# Patient Record
Sex: Male | Born: 1990 | Race: White | Hispanic: No | Marital: Single | Smoking: Current every day smoker
Health system: Southern US, Community
[De-identification: ages and names within clinical notes are randomized; demographics above are authoritative.]

## PROBLEM LIST (undated history)

## (undated) HISTORY — PX: APPENDECTOMY: SHX54

## (undated) HISTORY — PX: OTHER SURGICAL HISTORY: SHX169

---
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
## 2009-10-31 ENCOUNTER — Emergency Department (HOSPITAL_COMMUNITY)
Admission: EM | Admit: 2009-10-31 | Discharge: 2009-10-31 | Payer: Self-pay | Source: Home / Self Care | Admitting: Emergency Medicine

## 2010-11-24 LAB — BASIC METABOLIC PANEL
BUN: 10 mg/dL (ref 6–23)
Calcium: 9.2 mg/dL (ref 8.4–10.5)
Chloride: 105 mEq/L (ref 96–112)
Creatinine, Ser: 0.84 mg/dL (ref 0.4–1.5)
GFR calc Af Amer: >60 mL/min (ref >60)

## 2010-11-24 LAB — DIFFERENTIAL
Basophils Relative: 1 % (ref 0–1)
Eosinophils Absolute: 0 10*3/uL (ref 0.0–0.7)
Lymphs Abs: 1.9 10*3/uL (ref 0.7–4.0)
Neutro Abs: 5.9 10*3/uL (ref 1.7–7.7)
Neutrophils Relative %: 67 % (ref 43–77)

## 2010-11-24 LAB — URINALYSIS, ROUTINE W REFLEX MICROSCOPIC
Glucose, UA: NEGATIVE mg/dL
Protein, ur: NEGATIVE mg/dL
Specific Gravity, Urine: 1.024 (ref 1.005–1.030)
Urobilinogen, UA: 0.2 mg/dL (ref 0.0–1.0)

## 2010-11-24 LAB — CBC
MCV: 86.9 fL (ref 78.0–100.0)
Platelets: 159 10*3/uL (ref 150–400)
RBC: 4.5 MIL/uL (ref 4.22–5.81)
WBC: 8.8 10*3/uL (ref 4.0–10.5)

## 2010-11-24 LAB — RAPID URINE DRUG SCREEN, HOSP PERFORMED
Barbiturates: NOT DETECTED
Opiates: NOT DETECTED

## 2010-11-24 LAB — ETHANOL: Alcohol, Ethyl (B): <5 mg/dL (ref 0–10)

## 2012-04-17 ENCOUNTER — Encounter (HOSPITAL_COMMUNITY): Payer: Self-pay | Admitting: *Deleted

## 2012-04-17 ENCOUNTER — Emergency Department (HOSPITAL_COMMUNITY)
Admission: EM | Admit: 2012-04-17 | Discharge: 2012-04-17 | Disposition: A | Discharge status: Home / Self Care | Payer: Managed Care, Other (non HMO) | Attending: Emergency Medicine | Admitting: Emergency Medicine

## 2012-04-17 ENCOUNTER — Emergency Department (HOSPITAL_COMMUNITY): Payer: Managed Care, Other (non HMO)

## 2012-04-17 DIAGNOSIS — S60229A Contusion of unspecified hand, initial encounter: Secondary | ICD-10-CM | POA: Insufficient documentation

## 2012-04-17 DIAGNOSIS — Y9364 Activity, baseball: Secondary | ICD-10-CM | POA: Insufficient documentation

## 2012-04-17 DIAGNOSIS — S60221A Contusion of right hand, initial encounter: Secondary | ICD-10-CM

## 2012-04-17 DIAGNOSIS — Y9239 Other specified sports and athletic area as the place of occurrence of the external cause: Secondary | ICD-10-CM | POA: Insufficient documentation

## 2012-04-17 DIAGNOSIS — F17200 Nicotine dependence, unspecified, uncomplicated: Secondary | ICD-10-CM | POA: Insufficient documentation

## 2012-04-17 DIAGNOSIS — Z9089 Acquired absence of other organs: Secondary | ICD-10-CM | POA: Insufficient documentation

## 2012-04-17 DIAGNOSIS — IMO0002 Reserved for concepts with insufficient information to code with codable children: Secondary | ICD-10-CM | POA: Insufficient documentation

## 2012-04-17 MED ORDER — IBUPROFEN 800 MG PO TABS
800.0000 mg | ORAL_TABLET | Freq: Once | ORAL | Status: AC
  Administered 2012-04-17: 800 mg via ORAL
  Filled 2012-04-17: qty 1

## 2012-04-17 MED ORDER — IBUPROFEN 600 MG PO TABS: 600.0000 mg | ORAL_TABLET | Freq: Four times a day (QID) | ORAL | Status: AC | PRN

## 2012-04-17 MED ORDER — TRAMADOL HCL 50 MG PO TABS: 50.0000 mg | ORAL_TABLET | Freq: Four times a day (QID) | ORAL | Status: AC | PRN

## 2012-04-17 NOTE — ED Provider Notes (Signed)
History     CSN: [id number]  Arrival date & time 04/17/12  2132   First MD Initiated Contact with Patient 04/17/12 2209      Chief Complaint  Patient presents with  . Hand Injury    (Consider location/radiation/quality/duration/timing/severity/associated sxs/prior treatment) HPI Comments: Anthony Sanchez presents with pain and swelling to his right hand after being struck by a baseball as he was running the bases during a ballgame about 1 hour before arrival here.  He has constant pain in the lateral dorsal right hand without radiation.  It is worse with movement and palpation.  He has taken no medications or treatment prior to arrival.  The history is provided by the patient.    History reviewed. No pertinent past medical history.  Past Surgical History  Procedure Date  . Appendectomy   . Tubes in ears     History reviewed. No pertinent family history.  History  Substance Use Topics  . Smoking status: Current Everyday Smoker  . Smokeless tobacco: Not on file  . Alcohol Use: No      Review of Systems  Musculoskeletal: Positive for joint swelling and arthralgias.  Skin: Negative for wound.  Neurological: Negative for weakness and numbness.    Allergies  Vicodin  Home Medications   Current Outpatient Rx  Name Route Sig Dispense Refill  . IBUPROFEN 600 MG PO TABS Oral Take 1 tablet (600 mg total) by mouth every 6 (six) hours as needed for pain. 20 tablet 0  . TRAMADOL HCL 50 MG PO TABS Oral Take 1 tablet (50 mg total) by mouth every 6 (six) hours as needed for pain. 15 tablet 0    BP 123/71  Pulse 102  Temp 98.2 F (36.8 C) (Oral)  Resp 20  Ht 6' (1.829 m)  Wt 155 lb (70.308 kg)  BMI 21.02 kg/m2  SpO2 98%  Physical Exam  Constitutional: He appears well-developed and well-nourished.  HENT:  Head: Atraumatic.  Neck: Normal range of motion.  Cardiovascular:       Pulses equal bilaterally  Musculoskeletal:       Right hand: He exhibits tenderness.  He exhibits normal range of motion, normal capillary refill, no deformity and no swelling. normal sensation noted.       Hands: Neurological: He is alert. He has normal strength. He displays normal reflexes. No sensory deficit.       Equal strength  Skin: Skin is warm and dry.  Psychiatric: He has a normal mood and affect.    ED Course  Procedures (including critical care time)  Labs Reviewed - No data to display Dg Hand Complete Right  04/17/2012  *RADIOLOGY REPORT*  Clinical Data: Medial right hand pain.  Softball injury.  RIGHT HAND - COMPLETE 3+ VIEW  Comparison: None.  Findings: There is mild ulnar deviation of the index finger.  There is no fracture identified.  The visualized carpal bones appear within normal limits.  Metacarpal bases appear normal.  MCP joints normal.  There is no fracture or radiopaque foreign body.  There may be some soft tissue swelling along the index and long fingers.  IMPRESSION: No acute osseous abnormality.  Possible soft tissue swelling of the index and long fingers.  Original Report Authenticated By: Andreas Newport, M.D.     1. Contusion of hand, right       MDM  xrays reviewed and discussed with patient.  Jones dressing applied.  Pt prescribed ibuprofen,  Encouraged RICE,  Tramadol prn increased  pain.  F/u  With pcp if not improving over the next week.        Burgess Amor, [place] 04/18/12 [phone]

## 2012-04-17 NOTE — ED Notes (Signed)
Pt alert & oriented x4, stable gait. Patient given discharge instructions, paperwork & prescription(s). Patient  instructed to stop at the registration desk to finish any additional paperwork. Patient verbalized understanding. Pt left department w/ no further questions. 

## 2012-04-17 NOTE — ED Notes (Signed)
Rt hand injury playing softball tonight

## 2012-04-18 NOTE — ED Provider Notes (Signed)
Medical screening examination/treatment/procedure(s) were performed by non-physician practitioner and as supervising physician I was immediately available for consultation/collaboration. Devoria Albe, MD, FACEP   Ward Givens, MD 04/18/12 [phone]

## 2015-07-27 ENCOUNTER — Emergency Department (HOSPITAL_COMMUNITY): Payer: Managed Care, Other (non HMO)

## 2015-07-27 ENCOUNTER — Emergency Department (HOSPITAL_COMMUNITY)
Admission: EM | Admit: 2015-07-27 | Discharge: 2015-07-27 | Disposition: A | Discharge status: Home / Self Care | Payer: Managed Care, Other (non HMO) | Attending: Emergency Medicine | Admitting: Emergency Medicine

## 2015-07-27 ENCOUNTER — Encounter (HOSPITAL_COMMUNITY): Payer: Self-pay | Admitting: *Deleted

## 2015-07-27 DIAGNOSIS — Y998 Other external cause status: Secondary | ICD-10-CM | POA: Insufficient documentation

## 2015-07-27 DIAGNOSIS — R42 Dizziness and giddiness: Secondary | ICD-10-CM | POA: Insufficient documentation

## 2015-07-27 DIAGNOSIS — S060X1A Concussion with loss of consciousness of 30 minutes or less, initial encounter: Secondary | ICD-10-CM

## 2015-07-27 DIAGNOSIS — R112 Nausea with vomiting, unspecified: Secondary | ICD-10-CM | POA: Diagnosis not present

## 2015-07-27 DIAGNOSIS — R0602 Shortness of breath: Secondary | ICD-10-CM | POA: Insufficient documentation

## 2015-07-27 DIAGNOSIS — S3992XA Unspecified injury of lower back, initial encounter: Secondary | ICD-10-CM | POA: Diagnosis not present

## 2015-07-27 DIAGNOSIS — Y9289 Other specified places as the place of occurrence of the external cause: Secondary | ICD-10-CM | POA: Insufficient documentation

## 2015-07-27 DIAGNOSIS — S29001A Unspecified injury of muscle and tendon of front wall of thorax, initial encounter: Secondary | ICD-10-CM | POA: Insufficient documentation

## 2015-07-27 DIAGNOSIS — H538 Other visual disturbances: Secondary | ICD-10-CM | POA: Diagnosis not present

## 2015-07-27 DIAGNOSIS — S0081XA Abrasion of other part of head, initial encounter: Secondary | ICD-10-CM

## 2015-07-27 DIAGNOSIS — S199XXA Unspecified injury of neck, initial encounter: Secondary | ICD-10-CM | POA: Diagnosis not present

## 2015-07-27 DIAGNOSIS — R000 Tachycardia, unspecified: Secondary | ICD-10-CM | POA: Diagnosis not present

## 2015-07-27 DIAGNOSIS — M62830 Muscle spasm of back: Secondary | ICD-10-CM | POA: Insufficient documentation

## 2015-07-27 DIAGNOSIS — Y9389 Activity, other specified: Secondary | ICD-10-CM | POA: Insufficient documentation

## 2015-07-27 DIAGNOSIS — S3991XA Unspecified injury of abdomen, initial encounter: Secondary | ICD-10-CM | POA: Diagnosis not present

## 2015-07-27 DIAGNOSIS — T07XXXA Unspecified multiple injuries, initial encounter: Secondary | ICD-10-CM

## 2015-07-27 DIAGNOSIS — T148 Other injury of unspecified body region: Secondary | ICD-10-CM | POA: Diagnosis not present

## 2015-07-27 DIAGNOSIS — F17210 Nicotine dependence, cigarettes, uncomplicated: Secondary | ICD-10-CM | POA: Insufficient documentation

## 2015-07-27 DIAGNOSIS — S0990XA Unspecified injury of head, initial encounter: Secondary | ICD-10-CM | POA: Diagnosis present

## 2015-07-27 LAB — BASIC METABOLIC PANEL
ANION GAP: 9 (ref 5–15)
BUN: 14 mg/dL (ref 6–20)
CHLORIDE: 103 mmol/L (ref 101–111)
CO2: 28 mmol/L (ref 22–32)
Calcium: 9.7 mg/dL (ref 8.9–10.3)
Creatinine, Ser: 0.78 mg/dL (ref 0.61–1.24)
GFR calc Af Amer: >60 mL/min (ref >60)
GFR calc non Af Amer: >60 mL/min (ref >60)
GLUCOSE: 94 mg/dL (ref 65–99)
POTASSIUM: 3.7 mmol/L (ref 3.5–5.1)
Sodium: 140 mmol/L (ref 135–145)

## 2015-07-27 LAB — CBC WITH DIFFERENTIAL/PLATELET
BASOS ABS: 0 10*3/uL (ref 0.0–0.1)
Basophils Relative: 0 %
EOS PCT: 0 %
Eosinophils Absolute: 0 10*3/uL (ref 0.0–0.7)
HEMATOCRIT: 41.3 % (ref 39.0–52.0)
HEMOGLOBIN: 14.4 g/dL (ref 13.0–17.0)
LYMPHS ABS: 1.9 10*3/uL (ref 0.7–4.0)
LYMPHS PCT: 18 %
MCH: 31.9 pg (ref 26.0–34.0)
MCHC: 34.9 g/dL (ref 30.0–36.0)
MCV: 91.4 fL (ref 78.0–100.0)
Monocytes Absolute: 1.1 10*3/uL — ABNORMAL HIGH (ref 0.1–1.0)
Monocytes Relative: 10 %
NEUTROS ABS: 7.7 10*3/uL (ref 1.7–7.7)
NEUTROS PCT: 72 %
Platelets: 166 10*3/uL (ref 150–400)
RBC: 4.52 MIL/uL (ref 4.22–5.81)
RDW: 12.1 % (ref 11.5–15.5)
WBC: 10.8 10*3/uL — AB (ref 4.0–10.5)

## 2015-07-27 MED ORDER — DICLOFENAC SODIUM 50 MG PO TBEC: 50.0000 mg | DELAYED_RELEASE_TABLET | Freq: Two times a day (BID) | ORAL | Status: DC

## 2015-07-27 MED ORDER — ONDANSETRON HCL 4 MG/2ML IJ SOLN
4.0000 mg | Freq: Once | INTRAMUSCULAR | Status: AC
  Administered 2015-07-27: 4 mg via INTRAVENOUS
  Filled 2015-07-27: qty 2

## 2015-07-27 MED ORDER — CYCLOBENZAPRINE HCL 10 MG PO TABS: 10.0000 mg | ORAL_TABLET | Freq: Two times a day (BID) | ORAL | Status: DC | PRN

## 2015-07-27 MED ORDER — OXYCODONE-ACETAMINOPHEN 5-325 MG PO TABS
1.0000 | ORAL_TABLET | Freq: Once | ORAL | Status: AC
  Administered 2015-07-27: 1 via ORAL
  Filled 2015-07-27: qty 1

## 2015-07-27 MED ORDER — SODIUM CHLORIDE 0.9 % IV SOLN
INTRAVENOUS | Status: DC
  Administered 2015-07-27: 21:00:00 via INTRAVENOUS

## 2015-07-27 MED ORDER — BACITRACIN ZINC 500 UNIT/GM EX OINT
TOPICAL_OINTMENT | CUTANEOUS | Status: AC
  Administered 2015-07-27: 23:00:00
  Filled 2015-07-27: qty 0.9

## 2015-07-27 MED ORDER — HYDROMORPHONE HCL 1 MG/ML IJ SOLN
0.5000 mg | Freq: Once | INTRAMUSCULAR | Status: AC
  Administered 2015-07-27: 0.5 mg via INTRAVENOUS
  Filled 2015-07-27: qty 1

## 2015-07-27 MED ORDER — IOHEXOL 300 MG/ML  SOLN
75.0000 mL | Freq: Once | INTRAMUSCULAR | Status: AC | PRN
  Administered 2015-07-27: 75 mL via INTRAVENOUS

## 2015-07-27 MED ORDER — CYCLOBENZAPRINE HCL 10 MG PO TABS
5.0000 mg | ORAL_TABLET | Freq: Once | ORAL | Status: AC
  Administered 2015-07-27: 5 mg via ORAL
  Filled 2015-07-27: qty 1
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
## 2015-07-27 NOTE — ED Notes (Signed)
Pt verbalized understanding of no driving and to use caution within 4 hours of taking pain meds due to meds cause drowsiness 
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
## 2015-12-31 ENCOUNTER — Encounter (HOSPITAL_COMMUNITY): Payer: Self-pay | Admitting: Emergency Medicine

## 2015-12-31 ENCOUNTER — Inpatient Hospital Stay (HOSPITAL_COMMUNITY)
Admission: EM | Admit: 2015-12-31 | Discharge: 2016-01-03 | DRG: 603 | Disposition: A | Discharge status: Home / Self Care | Payer: 59 | Attending: Internal Medicine | Admitting: Internal Medicine

## 2015-12-31 DIAGNOSIS — L02414 Cutaneous abscess of left upper limb: Principal | ICD-10-CM | POA: Diagnosis present

## 2015-12-31 DIAGNOSIS — F1911 Other psychoactive substance abuse, in remission: Secondary | ICD-10-CM | POA: Diagnosis present

## 2015-12-31 DIAGNOSIS — F1410 Cocaine abuse, uncomplicated: Secondary | ICD-10-CM | POA: Diagnosis present

## 2015-12-31 DIAGNOSIS — F17210 Nicotine dependence, cigarettes, uncomplicated: Secondary | ICD-10-CM | POA: Diagnosis present

## 2015-12-31 DIAGNOSIS — E876 Hypokalemia: Secondary | ICD-10-CM | POA: Diagnosis present

## 2015-12-31 DIAGNOSIS — L03114 Cellulitis of left upper limb: Secondary | ICD-10-CM | POA: Diagnosis present

## 2015-12-31 DIAGNOSIS — L0390 Cellulitis, unspecified: Secondary | ICD-10-CM

## 2015-12-31 DIAGNOSIS — L0291 Cutaneous abscess, unspecified: Secondary | ICD-10-CM | POA: Diagnosis present

## 2015-12-31 LAB — CBC WITH DIFFERENTIAL/PLATELET
BASOS PCT: 0 %
Basophils Absolute: 0 10*3/uL (ref 0.0–0.1)
EOS ABS: 0.1 10*3/uL (ref 0.0–0.7)
Eosinophils Relative: 1 %
HCT: 39.5 % (ref 39.0–52.0)
Hemoglobin: 13.6 g/dL (ref 13.0–17.0)
Lymphocytes Relative: 15 %
Lymphs Abs: 1.9 10*3/uL (ref 0.7–4.0)
MCH: 31.1 pg (ref 26.0–34.0)
MCHC: 34.4 g/dL (ref 30.0–36.0)
MCV: 90.2 fL (ref 78.0–100.0)
MONO ABS: 1 10*3/uL (ref 0.1–1.0)
MONOS PCT: 9 %
NEUTROS PCT: 75 %
Neutro Abs: 9.2 10*3/uL — ABNORMAL HIGH (ref 1.7–7.7)
Platelets: 141 10*3/uL — ABNORMAL LOW (ref 150–400)
RBC: 4.38 MIL/uL (ref 4.22–5.81)
RDW: 11.9 % (ref 11.5–15.5)
WBC: 12.1 10*3/uL — ABNORMAL HIGH (ref 4.0–10.5)

## 2015-12-31 LAB — BASIC METABOLIC PANEL
Anion gap: 10 (ref 5–15)
BUN: 15 mg/dL (ref 6–20)
CALCIUM: 9.3 mg/dL (ref 8.9–10.3)
CO2: 26 mmol/L (ref 22–32)
CREATININE: 0.89 mg/dL (ref 0.61–1.24)
Chloride: 101 mmol/L (ref 101–111)
GFR calc non Af Amer: >60 mL/min (ref >60)
Glucose, Bld: 105 mg/dL — ABNORMAL HIGH (ref 65–99)
Potassium: 3.6 mmol/L (ref 3.5–5.1)
SODIUM: 137 mmol/L (ref 135–145)

## 2015-12-31 MED ORDER — ONDANSETRON HCL 4 MG/2ML IJ SOLN
4.0000 mg | Freq: Once | INTRAMUSCULAR | Status: AC
  Administered 2015-12-31: 4 mg via INTRAVENOUS
  Filled 2015-12-31: qty 2

## 2015-12-31 MED ORDER — HYDROMORPHONE HCL 1 MG/ML IJ SOLN
1.0000 mg | Freq: Once | INTRAMUSCULAR | Status: AC
  Administered 2015-12-31: 1 mg via INTRAVENOUS
  Filled 2015-12-31: qty 1

## 2015-12-31 NOTE — ED Notes (Signed)
Reddened area marked with skin marker at 2206.

## 2015-12-31 NOTE — ED Notes (Signed)
Patient complaining of abscess to left arm since yesterday. Patient has significant redness spreading away from abscess.

## 2015-12-31 NOTE — ED Provider Notes (Signed)
CSN: [id number]     Arrival date & time 12/31/15  2127 History   First MD Initiated Contact with Patient 12/31/15 2224     Chief Complaint  Patient presents with  . Abscess     (Consider location/radiation/quality/duration/timing/severity/associated sxs/prior Treatment) Patient is a 25 y.o. male presenting with abscess. The history is provided by the patient.  Abscess Location:  Shoulder/arm Shoulder/arm abscess location:  L elbow Abscess quality: painful, redness and warmth   Red streaking: yes   Duration:  2 days Progression:  Worsening Pain details:    Quality:  Sharp   Severity:  Severe   Timing:  Constant   Progression:  Worsening Chronicity:  New Context: insect bite/sting   Relieved by:  Nothing Worsened by:  Nothing tried Ineffective treatments:  None tried  Anthony Sanchez is a 25 y.o. male who presents to the ED with redness, swelling and pain to the left arm. He reports that 2 days ago he saw a spider on his shoulder and brushed it off. Later that day he noted a bump on his arm that looked like an insect bite. Today the area became swollen, red, tender and then red streaks started. He does not think he has had fever but states that the pain is severe.   History reviewed. No pertinent past medical history. Past Surgical History  Procedure Laterality Date  . Appendectomy    . Tubes in ears     History reviewed. No pertinent family history. Social History  Substance Use Topics  . Smoking status: Current Every Day Smoker -- 1.00 packs/day    Types: Cigarettes  . Smokeless tobacco: None  . Alcohol Use: Yes     Comment: occasionally    Review of Systems Negative except as stated in HPI   Allergies  Doxycycline and Vicodin  Home Medications   Prior to Admission medications   Medication Sig Start Date End Date Taking? Authorizing Provider  diphenhydrAMINE-zinc acetate (BENADRYL) cream Apply 1 application topically 3 (three) times daily as needed for  itching.   Yes Historical Provider, MD   BP 126/74 mmHg  Pulse 85  Temp(Src) 98.5 F (36.9 C) (Oral)  Resp 20  Ht 6' (1.829 m)  Wt 72.576 kg  BMI 21.70 kg/m2  SpO2 99% Physical Exam  Constitutional: He is oriented to person, place, and time. He appears well-developed and well-nourished.  HENT:  Head: Normocephalic and atraumatic.  Eyes: Conjunctivae and EOM are normal.  Neck: Normal range of motion. Neck supple.  Cardiovascular: Tachycardia present.   Pulmonary/Chest: Effort normal.  Musculoskeletal: Normal range of motion. He exhibits no edema.       Left elbow: He exhibits swelling. Decreased range of motion: due to pain. Tenderness found.       Arms: There is a firm raised area with erythema, swelling and tenderness to the palmar aspect of the left arm near the elbow, red streaking noted. Axillary tenderness.  Radial pulse 2+, adequate circulation.   Neurological: He is alert and oriented to person, place, and time. No cranial nerve deficit.  Skin: Skin is warm and dry.  Psychiatric: He has a normal mood and affect. His behavior is normal.  Nursing note and vitals reviewed.   ED Course  Procedures (including critical care time)  Area cleaned with alcohol, lidocaine 1% without epinephrine  Needle aspirate of thick yellow drainage 1 cc Sent for culture  Labs Review Results for orders placed or performed during the hospital encounter of 12/31/15 (from the  past 24 hour(s))  CBC with Differential     Status: Abnormal   Collection Time: 12/31/15 11:00 PM  Result Value Ref Range   WBC 12.1 (H) 4.0 - 10.5 K/uL   RBC 4.38 4.22 - 5.81 MIL/uL   Hemoglobin 13.6 13.0 - 17.0 g/dL   HCT 04.539.5 40.939.0 - 81.152.0 %   MCV 90.2 78.0 - 100.0 fL   MCH 31.1 26.0 - 34.0 pg   MCHC 34.4 30.0 - 36.0 g/dL   RDW 91.411.9 78.211.5 - 95.615.5 %   Platelets 141 (L) 150 - 400 K/uL   Neutrophils Relative % 75 %   Neutro Abs 9.2 (H) 1.7 - 7.7 K/uL   Lymphocytes Relative 15 %   Lymphs Abs 1.9 0.7 - 4.0 K/uL    Monocytes Relative 9 %   Monocytes Absolute 1.0 0.1 - 1.0 K/uL   Eosinophils Relative 1 %   Eosinophils Absolute 0.1 0.0 - 0.7 K/uL   Basophils Relative 0 %   Basophils Absolute 0.0 0.0 - 0.1 K/uL  Basic metabolic panel     Status: Abnormal   Collection Time: 12/31/15 11:00 PM  Result Value Ref Range   Sodium 137 135 - 145 mmol/L   Potassium 3.6 3.5 - 5.1 mmol/L   Chloride 101 101 - 111 mmol/L   CO2 26 22 - 32 mmol/L   Glucose, Bld 105 (H) 65 - 99 mg/dL   BUN 15 6 - 20 mg/dL   Creatinine, Ser 2.130.89 0.61 - 1.24 mg/dL   Calcium 9.3 8.9 - 08.610.3 mg/dL   GFR calc non Af Amer >60 >60 mL/min   GFR calc Af Amer >60 >60 mL/min   Anion gap 10 5 - 15    Imaging Review No results found. I have personally reviewed and evaluated the lab results as part of my medical decision-making.   MDM  25 y.o. male with abscess to the left arm and cellulitis. Discussed with Dr. Conley RollsLe and he will admit to continue antibiotics and observation. Discussed with the patient plan and he agrees.   Final diagnoses:  Abscess of left arm  Cellulitis of left upper extremity       Janne NapoleonHope M Neese, NP 01/01/16 0121  Mancel BaleElliott Wentz, MD 01/02/16 0030

## 2016-01-01 ENCOUNTER — Encounter (HOSPITAL_COMMUNITY): Payer: Self-pay | Admitting: Internal Medicine

## 2016-01-01 DIAGNOSIS — L0390 Cellulitis, unspecified: Secondary | ICD-10-CM | POA: Diagnosis not present

## 2016-01-01 DIAGNOSIS — F17210 Nicotine dependence, cigarettes, uncomplicated: Secondary | ICD-10-CM | POA: Diagnosis present

## 2016-01-01 DIAGNOSIS — F1410 Cocaine abuse, uncomplicated: Secondary | ICD-10-CM | POA: Diagnosis present

## 2016-01-01 DIAGNOSIS — L0291 Cutaneous abscess, unspecified: Secondary | ICD-10-CM | POA: Diagnosis present

## 2016-01-01 DIAGNOSIS — F1990 Other psychoactive substance use, unspecified, uncomplicated: Secondary | ICD-10-CM | POA: Diagnosis not present

## 2016-01-01 DIAGNOSIS — F1911 Other psychoactive substance abuse, in remission: Secondary | ICD-10-CM | POA: Diagnosis present

## 2016-01-01 DIAGNOSIS — E876 Hypokalemia: Secondary | ICD-10-CM | POA: Diagnosis present

## 2016-01-01 DIAGNOSIS — L03114 Cellulitis of left upper limb: Secondary | ICD-10-CM | POA: Diagnosis present

## 2016-01-01 DIAGNOSIS — L02414 Cutaneous abscess of left upper limb: Secondary | ICD-10-CM | POA: Diagnosis present

## 2016-01-01 LAB — CBC
HEMATOCRIT: 39.1 % (ref 39.0–52.0)
HEMOGLOBIN: 13.2 g/dL (ref 13.0–17.0)
MCH: 30.8 pg (ref 26.0–34.0)
MCHC: 33.8 g/dL (ref 30.0–36.0)
MCV: 91.4 fL (ref 78.0–100.0)
Platelets: 131 10*3/uL — ABNORMAL LOW (ref 150–400)
RBC: 4.28 MIL/uL (ref 4.22–5.81)
RDW: 12.2 % (ref 11.5–15.5)
WBC: 14.7 10*3/uL — AB (ref 4.0–10.5)

## 2016-01-01 LAB — BASIC METABOLIC PANEL
ANION GAP: 9 (ref 5–15)
BUN: 13 mg/dL (ref 6–20)
CALCIUM: 8.5 mg/dL — AB (ref 8.9–10.3)
CO2: 25 mmol/L (ref 22–32)
Chloride: 103 mmol/L (ref 101–111)
Creatinine, Ser: 0.84 mg/dL (ref 0.61–1.24)
Glucose, Bld: 142 mg/dL — ABNORMAL HIGH (ref 65–99)
POTASSIUM: 3.2 mmol/L — AB (ref 3.5–5.1)
SODIUM: 137 mmol/L (ref 135–145)

## 2016-01-01 MED ORDER — POTASSIUM CHLORIDE CRYS ER 20 MEQ PO TBCR
40.0000 meq | EXTENDED_RELEASE_TABLET | Freq: Every day | ORAL | Status: DC
  Filled 2016-01-01: qty 2

## 2016-01-01 MED ORDER — LIDOCAINE HCL (PF) 1 % IJ SOLN
5.0000 mL | Freq: Once | INTRAMUSCULAR | Status: AC
  Administered 2016-01-01: 5 mL
  Filled 2016-01-01: qty 5

## 2016-01-01 MED ORDER — VANCOMYCIN HCL IN DEXTROSE 1-5 GM/200ML-% IV SOLN
1000.0000 mg | Freq: Once | INTRAVENOUS | Status: AC
  Administered 2016-01-01: 1000 mg via INTRAVENOUS
  Filled 2016-01-01: qty 200

## 2016-01-01 MED ORDER — HYDROMORPHONE HCL 1 MG/ML IJ SOLN
1.0000 mg | INTRAMUSCULAR | Status: DC | PRN
  Administered 2016-01-01 – 2016-01-02 (×10): 1 mg via INTRAVENOUS
  Filled 2016-01-01 (×10): qty 1

## 2016-01-01 MED ORDER — TETANUS-DIPHTH-ACELL PERTUSSIS 5-2.5-18.5 LF-MCG/0.5 IM SUSP
0.5000 mL | Freq: Once | INTRAMUSCULAR | Status: AC
  Administered 2016-01-01: 0.5 mL via INTRAMUSCULAR
  Filled 2016-01-01: qty 0.5

## 2016-01-01 MED ORDER — DEXTROSE-NACL 5-0.9 % IV SOLN
INTRAVENOUS | Status: DC
  Administered 2016-01-02: via INTRAVENOUS

## 2016-01-01 MED ORDER — HEPARIN SODIUM (PORCINE) 5000 UNIT/ML IJ SOLN
5000.0000 [IU] | Freq: Three times a day (TID) | INTRAMUSCULAR | Status: DC
  Administered 2016-01-01: 5000 [IU] via SUBCUTANEOUS
  Filled 2016-01-01: qty 1

## 2016-01-01 MED ORDER — VANCOMYCIN HCL 10 G IV SOLR
1500.0000 mg | Freq: Two times a day (BID) | INTRAVENOUS | Status: DC
  Administered 2016-01-01 – 2016-01-03 (×4): 1500 mg via INTRAVENOUS
  Filled 2016-01-01 (×8): qty 1500

## 2016-01-01 MED ORDER — MUPIROCIN CALCIUM 2 % EX CREA
TOPICAL_CREAM | Freq: Two times a day (BID) | CUTANEOUS | Status: DC
  Administered 2016-01-01 – 2016-01-02 (×4): via TOPICAL
  Administered 2016-01-03: 1 via TOPICAL
  Filled 2016-01-01: qty 15

## 2016-01-01 MED ORDER — TETANUS-DIPHTHERIA TOXOIDS TD 5-2 LFU IM INJ
0.5000 mL | INJECTION | Freq: Once | INTRAMUSCULAR | Status: DC
  Filled 2016-01-01: qty 0.5

## 2016-01-01 MED ORDER — HYDROCODONE-ACETAMINOPHEN 5-325 MG PO TABS: 1.0000 | ORAL_TABLET | ORAL | Status: DC | PRN

## 2016-01-01 MED ORDER — ENOXAPARIN SODIUM 40 MG/0.4ML ~~LOC~~ SOLN
40.0000 mg | SUBCUTANEOUS | Status: DC
  Administered 2016-01-01 – 2016-01-02 (×2): 40 mg via SUBCUTANEOUS
  Filled 2016-01-01 (×3): qty 0.4

## 2016-01-01 MED ORDER — PIPERACILLIN-TAZOBACTAM 3.375 G IVPB
3.3750 g | Freq: Three times a day (TID) | INTRAVENOUS | Status: DC
  Administered 2016-01-01 – 2016-01-03 (×7): 3.375 g via INTRAVENOUS
  Filled 2016-01-01 (×7): qty 50

## 2016-01-01 MED ORDER — OXYCODONE-ACETAMINOPHEN 7.5-325 MG PO TABS
1.0000 | ORAL_TABLET | ORAL | Status: DC | PRN
  Administered 2016-01-02 – 2016-01-03 (×3): 1 via ORAL
  Filled 2016-01-01 (×3): qty 1

## 2016-01-01 MED ORDER — PIPERACILLIN-TAZOBACTAM 3.375 G IVPB 30 MIN
3.3750 g | Freq: Once | INTRAVENOUS | Status: AC
  Administered 2016-01-01: 3.375 g via INTRAVENOUS
  Filled 2016-01-01: qty 50

## 2016-01-01 MED ORDER — POTASSIUM CHLORIDE CRYS ER 20 MEQ PO TBCR
40.0000 meq | EXTENDED_RELEASE_TABLET | Freq: Every day | ORAL | Status: AC
  Administered 2016-01-01: 40 meq via ORAL

## 2016-01-01 MED ORDER — DEXTROSE-NACL 5-0.9 % IV SOLN
INTRAVENOUS | Status: DC
  Administered 2016-01-01: 03:00:00 via INTRAVENOUS
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
## 2016-01-01 NOTE — Progress Notes (Signed)
0637 WBC 14.7, K+ 3.2 this AM lab results. MD notified.

## 2016-01-01 NOTE — Progress Notes (Signed)
Pain down to 6 from 8, Resting

## 2016-01-01 NOTE — Consult Note (Signed)
Reason for Consult: Right is, left arm Referring Physician: Dr. [name] is an 25 y.o. male.  HPI: Patient is a 25 year old white male who used IV cocaine in the left arm several days ago. He has since developed left antecubital arm swelling. He was seen in the emergency room and some purulent fluid was drained from the wound. He was admitted to the hospital for further evaluation treatment. He states he rarely uses IV cocaine. His left arm is tender to touch. Some swelling has worsened over the last 48 hours.  History reviewed. No pertinent past medical history.  Past Surgical History  Procedure Laterality Date  . Appendectomy    . Tubes in ears      History reviewed. No pertinent family history.  Social History:  reports that he has been smoking Cigarettes.  He has been smoking about 1.00 pack per day. He does not have any smokeless tobacco history on file. He reports that he drinks alcohol. He reports that he uses illicit drugs (Marijuana).  Allergies:  Allergies  Allergen Reactions  . Doxycycline Itching and Rash  . Vicodin [Hydrocodone-Acetaminophen] Itching and Rash    Medications:  Prior to Admission:  Prescriptions prior to admission  Medication Sig Dispense Refill Last Dose  . diphenhydrAMINE-zinc acetate (BENADRYL) cream Apply 1 application topically 3 (three) times daily as needed for itching.   12/31/2015 at Unknown time    Results for orders placed or performed during the hospital encounter of 12/31/15 (from the past 48 hour(s))  CBC with Differential     Status: Abnormal   Collection Time: 12/31/15 11:00 PM  Result Value Ref Range   WBC 12.1 (H) 4.0 - 10.5 K/uL   RBC 4.38 4.22 - 5.81 MIL/uL   Hemoglobin 13.6 13.0 - 17.0 g/dL   HCT 39.5 39.0 - 52.0 %   MCV 90.2 78.0 - 100.0 fL   MCH 31.1 26.0 - 34.0 pg   MCHC 34.4 30.0 - 36.0 g/dL   RDW 11.9 11.5 - 15.5 %   Platelets 141 (L) 150 - 400 K/uL   Neutrophils Relative % 75 %   Neutro Abs 9.2 (H)  1.7 - 7.7 K/uL   Lymphocytes Relative 15 %   Lymphs Abs 1.9 0.7 - 4.0 K/uL   Monocytes Relative 9 %   Monocytes Absolute 1.0 0.1 - 1.0 K/uL   Eosinophils Relative 1 %   Eosinophils Absolute 0.1 0.0 - 0.7 K/uL   Basophils Relative 0 %   Basophils Absolute 0.0 0.0 - 0.1 K/uL  Basic metabolic panel     Status: Abnormal   Collection Time: 12/31/15 11:00 PM  Result Value Ref Range   Sodium 137 135 - 145 mmol/L   Potassium 3.6 3.5 - 5.1 mmol/L   Chloride 101 101 - 111 mmol/L   CO2 26 22 - 32 mmol/L   Glucose, Bld 105 (H) 65 - 99 mg/dL   BUN 15 6 - 20 mg/dL   Creatinine, Ser 0.89 0.61 - 1.24 mg/dL   Calcium 9.3 8.9 - 10.3 mg/dL   GFR calc non Af Amer >60 >60 mL/min   GFR calc Af Amer >60 >60 mL/min    Comment: (NOTE) The eGFR has been calculated using the CKD EPI equation. This calculation has not been validated in all clinical situations. eGFR's persistently <60 mL/min signify possible Chronic Kidney Disease.    Anion gap 10 5 - 15  Basic metabolic panel     Status: Abnormal   Collection  Time: 01/01/16  4:37 AM  Result Value Ref Range   Sodium 137 135 - 145 mmol/L   Potassium 3.2 (L) 3.5 - 5.1 mmol/L   Chloride 103 101 - 111 mmol/L   CO2 25 22 - 32 mmol/L   Glucose, Bld 142 (H) 65 - 99 mg/dL   BUN 13 6 - 20 mg/dL   Creatinine, Ser 0.84 0.61 - 1.24 mg/dL   Calcium 8.5 (L) 8.9 - 10.3 mg/dL   GFR calc non Af Amer >60 >60 mL/min   GFR calc Af Amer >60 >60 mL/min    Comment: (NOTE) The eGFR has been calculated using the CKD EPI equation. This calculation has not been validated in all clinical situations. eGFR's persistently <60 mL/min signify possible Chronic Kidney Disease.    Anion gap 9 5 - 15  CBC     Status: Abnormal   Collection Time: 01/01/16  4:37 AM  Result Value Ref Range   WBC 14.7 (H) 4.0 - 10.5 K/uL   RBC 4.28 4.22 - 5.81 MIL/uL   Hemoglobin 13.2 13.0 - 17.0 g/dL   HCT 39.1 39.0 - 52.0 %   MCV 91.4 78.0 - 100.0 fL   MCH 30.8 26.0 - 34.0 pg   MCHC 33.8 30.0  - 36.0 g/dL   RDW 12.2 11.5 - 15.5 %   Platelets 131 (L) 150 - 400 K/uL    No results found.  ROS:  Constitutional: negative Respiratory: negative Cardiovascular: negative Integument/breast: Tender left arm.  Blood pressure 120/60, pulse 89, temperature 98.6 F (37 C), temperature source Oral, resp. rate 20, height 6' (1.829 m), weight 72.576 kg (160 lb), SpO2 98 %. Physical Exam: Patient is a well-developed well-nourished white male no acute distress. Left arm examination reveals a small 1 cm abscess cavity which has been drained. Some purulent fluid is still emanating from it. Surrounding erythema is noted. There is no neurovascular compromise of the left hand. He is able to move the left hand without difficulty.  Assessment/Plan: Impression: Cellulitis with abscess, left antecubital fossa secondary to IV drug use Plan: Will elevate left arm. Agree with Zosyn and vancomycin. No need for I and D at this time as it is already draining. Local wound care has been ordered.  Gayanne Prescott A 01/01/2016, 7:49 AM

## 2016-01-01 NOTE — Progress Notes (Signed)
Pharmacy Antibiotic Note  Anthony FailMichael J Sanchez is a 25 y.o. male admitted on 12/31/2015 with cellulitis.  Pharmacy has been consulted for vancomycin and zosyn dosing. Initial doses given in the ED  Plan: Vancomycin 1500 IV every 12 hours.  Goal trough 10-15 mcg/mL.  Cont zosyn 3.375 gm IV q8 hours Monitor renal function, cultures and clinical course  Height: 6' (182.9 cm) Weight: 160 lb (72.576 kg) IBW/kg (Calculated) : 77.6  Temp (24hrs), Avg:98.6 F (37 C), Min:98.4 F (36.9 C), Max:98.7 F (37.1 C)   Recent Labs Lab 12/31/15 2300 01/01/16 0437  WBC 12.1* 14.7*  CREATININE 0.89 0.84    Estimated Creatinine Clearance: 138 mL/min (by C-G formula based on Cr of 0.84).    Allergies  Allergen Reactions  . Doxycycline Itching and Rash  . Vicodin [Hydrocodone-Acetaminophen] Itching and Rash    Antimicrobials this admission: vanc 4/30 >>  zosyn 4/30 >>   Thank you for allowing pharmacy to be a part of this patient's care.  Talbert CageSeay, Anthony Sanchez 01/01/2016 8:16 AM

## 2016-01-01 NOTE — ED Provider Notes (Signed)
Pt complains of worsening, burning left inner forearm pain and swelling  beginning yesterday. He reports that a spider was on his shirt two days ago. Pt states that he tried to pop the swollen area yesterday and no pus was discharged. He endorses pain exacerbation with palpation to his left arm; he states that his entire left arm is swollen and painful starting tonight, and the pain radiates into his left underarm as well. He applied ice to his abscess this afternoon with no relief. Pt is left hand dominant. He denies fever, chills, or any other associated symptoms.   Patient is noted to have an area that is consistent with abscess in his antecubital area of his left forearm with redness and swelling of his maximal forearm and distal upper arm that extends almost to his axilla.      Medical screening examination/treatment/procedure(s) were conducted as a shared visit with non-physician practitioner(s) and myself.  I personally evaluated the patient during the encounter.     Devoria AlbeIva Graves Nipp, MD, FACEP  I personally performed the services described in this documentation, which was scribed in my presence. The recorded information has been reviewed and considered.  Devoria AlbeIva Ivalene Platte, MD, Concha PyoFACEP    Apple Dearmas, MD 01/01/16 [phone]

## 2016-01-01 NOTE — H&P (Signed)
History and Physical    Anthony Sanchez JXB:[id number] DOB: February 06, 1991 DOA: 12/31/2015  Referring MD/NP/PA: Kerrie BuffaloHope Neese NP.  PCP: No primary care provider on file.  Outpatient Specialists: none Patient coming from: home  Chief Complaint: abscess  HPI: Anthony Sanchez is a 25 y.o. male who is otherwise normally healthy, presents to the ED with complaints of worsening, burning pain that radiates up his arm and to his armpit which onset yesterday. He admits to pain exacerbation with palpation of the arm.  He reports that the abscess was small yesterday; but it has since grown in size and the pain has increased. He has a hx of IV drug use, typically injecting cocaine. He completed rehab about six months ago but relapsed approximately 3 months ago. He denies any IV drug use in the past month, though he does still smoke marajuana. He does not have DM. He denies any fever, chills, or other associated symptoms.  ED Course: While in the ED his WBC and glucose were found to be mildly elevated. His vital signs were stable. Wound cultures were collected. Hospitalist was asked to refer for admission.  Review of Systems: As per HPI otherwise 10 point review of systems negative.    History reviewed. No pertinent past medical history.  Past Surgical History  Procedure Laterality Date  . Appendectomy    . Tubes in ears       reports that he has been smoking Cigarettes.  He has been smoking about 1.00 pack per day. He does not have any smokeless tobacco history on file. He reports that he drinks alcohol. He reports that he uses illicit drugs (Marijuana).  Allergies  Allergen Reactions  . Doxycycline Itching and Rash  . Vicodin [Hydrocodone-Acetaminophen] Itching and Rash    History reviewed. No pertinent family history.   Prior to Admission medications   Medication Sig Start Date End Date Taking? Authorizing Provider  diphenhydrAMINE-zinc acetate (BENADRYL) cream Apply 1 application  topically 3 (three) times daily as needed for itching.   Yes Historical Provider, MD    Physical Exam: Filed Vitals:   12/31/15 2145 12/31/15 2341  BP: 139/73 126/74  Pulse: 101 85  Temp: 98.4 F (36.9 C) 98.5 F (36.9 C)  TempSrc: Oral Oral  Resp: 14 20  Height: 6' (1.829 m)   Weight: 72.576 kg (160 lb)   SpO2: 99% 99%      Constitutional: NAD, calm, comfortable Filed Vitals:   12/31/15 2145 12/31/15 2341  BP: 139/73 126/74  Pulse: 101 85  Temp: 98.4 F (36.9 C) 98.5 F (36.9 C)  TempSrc: Oral Oral  Resp: 14 20  Height: 6' (1.829 m)   Weight: 72.576 kg (160 lb)   SpO2: 99% 99%   Eyes: PERRL, lids and conjunctivae normal ENMT: Mucous membranes are moist. Posterior pharynx clear of any exudate or lesions.Normal dentition.  Neck: normal, supple, no masses, no thyromegaly Respiratory: clear to auscultation bilaterally, no wheezing, no crackles. Normal respiratory effort. No accessory muscle use.  Cardiovascular: Regular rate and rhythm, no murmurs / rubs / gallops. No extremity edema. 2+ pedal pulses. No carotid bruits.  Abdomen: no tenderness, no masses palpated. No hepatosplenomegaly. Bowel sounds positive.  Musculoskeletal: no clubbing / cyanosis. No joint deformity upper and lower extremities. Good ROM, no contractures. Normal muscle tone.  Skin: left arm edema, erythema. Small area of fluctuant. She has purulent aspiration done by ED physician. Neurologic: CN 2-12 grossly intact. Sensation intact, DTR normal. Strength 5/5 in all 4.  Psychiatric: Normal judgment and insight. Alert and oriented x 3. Normal mood.   Labs on Admission: I have personally reviewed following labs and imaging studies  CBC:  Recent Labs Lab 12/31/15 2300  WBC 12.1*  NEUTROABS 9.2*  HGB 13.6  HCT 39.5  MCV 90.2  PLT 141*   Basic Metabolic Panel:  Recent Labs Lab 12/31/15 2300  NA 137  K 3.6  CL 101  CO2 26  GLUCOSE 105*  BUN 15  CREATININE 0.89  CALCIUM 9.3   Urine  analysis:    Component Value Date/Time   COLORURINE YELLOW 10/31/2009 1848   APPEARANCEUR CLEAR 10/31/2009 1848   LABSPEC 1.024 10/31/2009 1848   PHURINE 6.0 10/31/2009 1848   GLUCOSEU NEGATIVE 10/31/2009 1848   HGBUR NEGATIVE 10/31/2009 1848   BILIRUBINUR NEGATIVE 10/31/2009 1848   KETONESUR NEGATIVE 10/31/2009 1848   PROTEINUR NEGATIVE 10/31/2009 1848   UROBILINOGEN 0.2 10/31/2009 1848   NITRITE NEGATIVE 10/31/2009 1848   LEUKOCYTESUR  10/31/2009 1848    NEGATIVE MICROSCOPIC NOT DONE ON URINES WITH NEGATIVE PROTEIN, BLOOD, LEUKOCYTES, NITRITE, OR GLUCOSE <1000 mg/dL.    EKG: Independently reviewed.   Assessment/Plan Principal Problem:   Cellulitis and abscess Active Problems:   History of drug abuse  1. Cellulitis with abscess. Will give Vanc and Zosyn. Will give pain meds. Will also give tetanus shot. Will be kept NPO for possible I&D. Will consult general surgery. Wound cultures collected and his site was marked.  2. Hx of IV drug use. He recently completed rehab, but relapsed about 3 months ago. Denies any IV use for past month.  Will give narcotic for his acute pain.  He and his wife is agreeable to this.    DVT prophylaxis: Lovenox Code Status: Full  Family Communication: wife, Hospital doctor, present at bedside. Disposition Plan: anticipate discharge home in 1-2 days Consults called: none Admission status: inpatient   Houston Siren, MD FACP [facility]  If 7PM-7AM, please contact night-coverage [email or web address] Password [email or web address] 01/01/2016, 1:16 AM   By signing my name below, I, Adron Bene, attest that this documentation has been prepared under the direction and in the presence of Houston Siren, MD. Electronically Signed: Adron Bene, Scribe 01/01/2016 1:12am

## 2016-01-01 NOTE — Progress Notes (Signed)
Introduced self to patient. Patient requesting pain medication. Rating pain to left arm 8 that is throbbing.

## 2016-01-02 LAB — CBC
HCT: 38 % — ABNORMAL LOW (ref 39.0–52.0)
HEMOGLOBIN: 12.6 g/dL — AB (ref 13.0–17.0)
MCH: 30.4 pg (ref 26.0–34.0)
MCHC: 33.2 g/dL (ref 30.0–36.0)
MCV: 91.6 fL (ref 78.0–100.0)
PLATELETS: 126 10*3/uL — AB (ref 150–400)
RBC: 4.15 MIL/uL — AB (ref 4.22–5.81)
RDW: 12.2 % (ref 11.5–15.5)
WBC: 10.9 10*3/uL — AB (ref 4.0–10.5)

## 2016-01-02 LAB — BASIC METABOLIC PANEL
Anion gap: 7 (ref 5–15)
BUN: 7 mg/dL (ref 6–20)
CO2: 25 mmol/L (ref 22–32)
CREATININE: 0.73 mg/dL (ref 0.61–1.24)
Calcium: 8.6 mg/dL — ABNORMAL LOW (ref 8.9–10.3)
Chloride: 105 mmol/L (ref 101–111)
Glucose, Bld: 107 mg/dL — ABNORMAL HIGH (ref 65–99)
Potassium: 3.8 mmol/L (ref 3.5–5.1)
SODIUM: 137 mmol/L (ref 135–145)

## 2016-01-02 LAB — HIV ANTIBODY (ROUTINE TESTING W REFLEX): HIV Screen 4th Generation wRfx: NONREACTIVE

## 2016-01-02 LAB — HEPATITIS PANEL, ACUTE
HEP B S AG: NEGATIVE
Hep A IgM: NEGATIVE
Hep B C IgM: NEGATIVE

## 2016-01-02 NOTE — Progress Notes (Signed)
  Subjective: States his left arm is less swollen.  Objective: Vital signs in last 24 hours: Temp:  [98 F (36.7 C)-100.2 F (37.9 C)] 98 F (36.7 C) (04/30 0700) Pulse Rate:  [74-81] 74 (04/30 0700) Resp:  [20] 20 (04/29 2140) BP: (106-113)/(50-68) 110/65 mmHg (04/30 0700) SpO2:  [98 %-100 %] 100 % (04/30 0700) Last BM Date: 12/31/15  Intake/Output from previous day: 04/29 0701 - 04/30 0700 In: 720 [P.O.:720] Out: -  Intake/Output this shift:    General appearance: alert, cooperative and no distress Extremities: Left arm swelling and erythema has decreased. Purulent fluid expressed from wound. Cultures were pending.  Lab Results:   Recent Labs  01/01/16 0437 01/02/16 0623  WBC 14.7* 10.9*  HGB 13.2 12.6*  HCT 39.1 38.0*  PLT 131* 126*   BMET  Recent Labs  01/01/16 0437 01/02/16 0623  NA 137 137  K 3.2* 3.8  CL 103 105  CO2 25 25  GLUCOSE 142* 107*  BUN 13 7  CREATININE 0.84 0.73  CALCIUM 8.5* 8.6*   PT/INR No results for input(s): LABPROT, INR in the last 72 hours.  Studies/Results: No results found.  Anti-infectives: Anti-infectives    Start     Dose/Rate Route Frequency Ordered Stop   01/01/16 0800  piperacillin-tazobactam (ZOSYN) IVPB 3.375 g     3.375 g 12.5 mL/hr over 240 Minutes Intravenous Every 8 hours 01/01/16 0750     01/01/16 0800  vancomycin (VANCOCIN) 1,500 mg in sodium chloride 0.9 % 500 mL IVPB     1,500 mg 250 mL/hr over 120 Minutes Intravenous Every 12 hours 01/01/16 0750     01/01/16 0030  vancomycin (VANCOCIN) IVPB 1000 mg/200 mL premix     1,000 mg 200 mL/hr over 60 Minutes Intravenous  Once 01/01/16 0016 01/01/16 0229   01/01/16 0030  piperacillin-tazobactam (ZOSYN) IVPB 3.375 g     3.375 g 100 mL/hr over 30 Minutes Intravenous  Once 01/01/16 0016 01/01/16 0122      Assessment/Plan: Impression: Cellulitis with draining abscess, left antecubital fossa, secondary to IV drug use. Plan: No cyanosis is much improved.  Wound is draining. No need for surgical incision and drainage. Continue current care.  LOS: 1 day    Kegan Shepardson A 01/02/2016
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
## 2016-01-03 DIAGNOSIS — L0291 Cutaneous abscess, unspecified: Secondary | ICD-10-CM

## 2016-01-03 DIAGNOSIS — L0390 Cellulitis, unspecified: Secondary | ICD-10-CM

## 2016-01-03 DIAGNOSIS — F1990 Other psychoactive substance use, unspecified, uncomplicated: Secondary | ICD-10-CM

## 2016-01-03 LAB — CBC
HEMATOCRIT: 38.2 % — AB (ref 39.0–52.0)
Hemoglobin: 12.7 g/dL — ABNORMAL LOW (ref 13.0–17.0)
MCH: 30.4 pg (ref 26.0–34.0)
MCHC: 33.2 g/dL (ref 30.0–36.0)
MCV: 91.4 fL (ref 78.0–100.0)
PLATELETS: 134 10*3/uL — AB (ref 150–400)
RBC: 4.18 MIL/uL — ABNORMAL LOW (ref 4.22–5.81)
RDW: 12.1 % (ref 11.5–15.5)
WBC: 9.8 10*3/uL (ref 4.0–10.5)

## 2016-01-03 LAB — PROTIME-INR
INR: 1.21 (ref 0.00–1.49)
PROTHROMBIN TIME: 15.4 s — AB (ref 11.6–15.2)

## 2016-01-03 MED ORDER — KETOROLAC TROMETHAMINE 10 MG PO TABS: 10.0000 mg | ORAL_TABLET | Freq: Four times a day (QID) | ORAL | Status: AC | PRN

## 2016-01-03 MED ORDER — SULFAMETHOXAZOLE-TRIMETHOPRIM 800-160 MG PO TABS: 1.0000 | ORAL_TABLET | Freq: Two times a day (BID) | ORAL | Status: AC

## 2016-01-03 NOTE — Progress Notes (Addendum)
Patient discharged with instructions, prescription, and care notes.  Verbalized understanding via teach back.  IV was removed and the site was WNL. Patient voiced no further complaints or concerns at the time of discharge.  Appointments scheduled per instructions.  Patient left the floor via ambulation with staff and family in stable condition.   Patient changed his dressing.  The patient verbalized how he was suppose to change the dressing daily.  The supplies from his room was sent home with him.
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
## 2016-01-03 NOTE — Care Management Note (Signed)
Case Management Note  Patient Details  Name: Synetta FailMichael J Zatarain MRN: [id number] Date of Birth: October 23, 1990  Subjective/Objective:                    Action/Plan:Home with self care.   Expected Discharge Date:                  Expected Discharge Plan:  Home/Self Care  In-House Referral:     Discharge planning Services  CM Consult  Post Acute Care Choice:    Choice offered to:  NA  DME Arranged:  N/A DME Agency:  NA  HH Arranged:    HH Agency:     Status of Service:  Completed, signed off  Medicare Important Message Given:    Date Medicare IM Given:    Medicare IM give by:    Date Additional Medicare IM Given:    Additional Medicare Important Message give by:     If discussed at Long Length of Stay Meetings, dates discussed:    Additional Comments:  Adonis HugueninBerkhead, Davena Julian L, RN 01/03/2016, 11:22 AM

## 2016-01-03 NOTE — Progress Notes (Signed)
  Subjective: Patient states the left arm swelling and pain have significantly decreased.  Objective: Vital signs in last 24 hours: Temp:  [97.1 F (36.2 C)-98.4 F (36.9 C)] 97.1 F (36.2 C) (05/01 0657) Pulse Rate:  [72-80] 74 (05/01 0657) Resp:  [18] 18 (04/30 1439) BP: (117-137)/(61-67) 118/61 mmHg (05/01 0657) SpO2:  [96 %-100 %] 100 % (05/01 0657) Last BM Date: 01/02/16  Intake/Output from previous day: 04/30 0701 - 05/01 0700 In: 720 [P.O.:720] Out: -  Intake/Output this shift:    General appearance: alert, cooperative and no distress Skin: Left antecubital cellulitis and induration significantly improved. Less drainage noted from abscess. Left arm neurovascularly intact.  Lab Results:   Recent Labs  01/02/16 0623 01/03/16 0501  WBC 10.9* 9.8  HGB 12.6* 12.7*  HCT 38.0* 38.2*  PLT 126* 134*   BMET  Recent Labs  01/01/16 0437 01/02/16 0623  NA 137 137  K 3.2* 3.8  CL 103 105  CO2 25 25  GLUCOSE 142* 107*  BUN 13 7  CREATININE 0.84 0.73  CALCIUM 8.5* 8.6*   PT/INR  Recent Labs  01/03/16 0501  LABPROT 15.4*  INR 1.21    Studies/Results: No results found.  Anti-infectives: Anti-infectives    Start     Dose/Rate Route Frequency Ordered Stop   01/01/16 0800  piperacillin-tazobactam (ZOSYN) IVPB 3.375 g     3.375 g 12.5 mL/hr over 240 Minutes Intravenous Every 8 hours 01/01/16 0750     01/01/16 0800  vancomycin (VANCOCIN) 1,500 mg in sodium chloride 0.9 % 500 mL IVPB     1,500 mg 250 mL/hr over 120 Minutes Intravenous Every 12 hours 01/01/16 0750     01/01/16 0030  vancomycin (VANCOCIN) IVPB 1000 mg/200 mL premix     1,000 mg 200 mL/hr over 60 Minutes Intravenous  Once 01/01/16 0016 01/01/16 0229   01/01/16 0030  piperacillin-tazobactam (ZOSYN) IVPB 3.375 g     3.375 g 100 mL/hr over 30 Minutes Intravenous  Once 01/01/16 0016 01/01/16 0122      Assessment/Plan: Impression: Cellulitis and abscess, left arm, significant  improved. Plan: Okay for discharge from surgery standpoint. I given patient instructions on wound care. I would discharge on Bactrim for 10 days. He is to follow-up in my office as needed.  LOS: 2 days    Markis Langland A 01/03/2016

## 2016-01-04 LAB — WOUND CULTURE

## 2016-01-06 LAB — CULTURE, BLOOD (ROUTINE X 2)
Culture: NO GROWTH
Culture: NO GROWTH

## 2016-07-05 ENCOUNTER — Emergency Department (HOSPITAL_COMMUNITY)
Admission: EM | Admit: 2016-07-05 | Discharge: 2016-07-05 | Disposition: A | Discharge status: Home / Self Care | Payer: BLUE CROSS/BLUE SHIELD | Attending: Dermatology | Admitting: Dermatology

## 2016-07-05 ENCOUNTER — Encounter (HOSPITAL_COMMUNITY): Payer: Self-pay | Admitting: Emergency Medicine

## 2016-07-05 DIAGNOSIS — M7989 Other specified soft tissue disorders: Secondary | ICD-10-CM | POA: Insufficient documentation

## 2016-07-05 DIAGNOSIS — W57XXXA Bitten or stung by nonvenomous insect and other nonvenomous arthropods, initial encounter: Secondary | ICD-10-CM | POA: Insufficient documentation

## 2016-07-05 DIAGNOSIS — Y999 Unspecified external cause status: Secondary | ICD-10-CM | POA: Insufficient documentation

## 2016-07-05 DIAGNOSIS — Z5321 Procedure and treatment not carried out due to patient leaving prior to being seen by health care provider: Secondary | ICD-10-CM | POA: Diagnosis not present

## 2016-07-05 DIAGNOSIS — Y929 Unspecified place or not applicable: Secondary | ICD-10-CM | POA: Insufficient documentation

## 2016-07-05 DIAGNOSIS — F17210 Nicotine dependence, cigarettes, uncomplicated: Secondary | ICD-10-CM | POA: Insufficient documentation

## 2016-07-05 DIAGNOSIS — Y939 Activity, unspecified: Secondary | ICD-10-CM | POA: Insufficient documentation

## 2016-07-05 NOTE — ED Notes (Signed)
No answer when called from lobby 

## 2016-07-05 NOTE — ED Triage Notes (Signed)
Pt thought he had an insect bite to right posteior hand x 2 days ago. Started itching and is now swollen. Right hand swollen especially to right index finger, swelling and redness noted to right finger and spreads under middle and index finger to wrist. Unable to straighten or make fist with index finger.

## 2016-07-05 NOTE — ED Notes (Signed)
No answer from lobby  

## 2018-01-04 ENCOUNTER — Other Ambulatory Visit: Payer: Self-pay

## 2018-01-04 ENCOUNTER — Emergency Department (HOSPITAL_COMMUNITY): Payer: Self-pay

## 2018-01-04 ENCOUNTER — Emergency Department (HOSPITAL_COMMUNITY)
Admission: EM | Admit: 2018-01-04 | Discharge: 2018-01-05 | Disposition: A | Discharge status: Home / Self Care | Payer: Self-pay | Attending: Emergency Medicine | Admitting: Emergency Medicine

## 2018-01-04 ENCOUNTER — Emergency Department (HOSPITAL_COMMUNITY)
Admission: EM | Admit: 2018-01-04 | Discharge: 2018-01-04 | Disposition: A | Discharge status: Home / Self Care | Payer: Self-pay | Attending: Emergency Medicine | Admitting: Emergency Medicine

## 2018-01-04 DIAGNOSIS — F1910 Other psychoactive substance abuse, uncomplicated: Secondary | ICD-10-CM

## 2018-01-04 DIAGNOSIS — T50901A Poisoning by unspecified drugs, medicaments and biological substances, accidental (unintentional), initial encounter: Secondary | ICD-10-CM | POA: Insufficient documentation

## 2018-01-04 DIAGNOSIS — F17210 Nicotine dependence, cigarettes, uncomplicated: Secondary | ICD-10-CM | POA: Insufficient documentation

## 2018-01-04 LAB — CBC WITH DIFFERENTIAL/PLATELET
BASOS ABS: 0 10*3/uL (ref 0.0–0.1)
Basophils Absolute: 0 10*3/uL (ref 0.0–0.1)
Basophils Relative: 0 %
Basophils Relative: 0 %
EOS ABS: 0.1 10*3/uL (ref 0.0–0.7)
EOS PCT: 2 %
EOS PCT: 1 %
Eosinophils Absolute: 0.1 10*3/uL (ref 0.0–0.7)
HCT: 36.9 % — ABNORMAL LOW (ref 39.0–52.0)
HEMATOCRIT: 34.2 % — AB (ref 39.0–52.0)
Hemoglobin: 12.6 g/dL — ABNORMAL LOW (ref 13.0–17.0)
Hemoglobin: 11.8 g/dL — ABNORMAL LOW (ref 13.0–17.0)
LYMPHS ABS: 2.1 10*3/uL (ref 0.7–4.0)
LYMPHS PCT: 29 %
Lymphocytes Relative: 34 %
Lymphs Abs: 2.1 10*3/uL (ref 0.7–4.0)
MCH: 30.4 pg (ref 26.0–34.0)
MCH: 30.6 pg (ref 26.0–34.0)
MCHC: 34.1 g/dL (ref 30.0–36.0)
MCHC: 34.5 g/dL (ref 30.0–36.0)
MCV: 88.9 fL (ref 78.0–100.0)
MCV: 88.8 fL (ref 78.0–100.0)
MONO ABS: 0.8 10*3/uL (ref 0.1–1.0)
Monocytes Absolute: 0.7 10*3/uL (ref 0.1–1.0)
Monocytes Relative: 12 %
Monocytes Relative: 11 %
Neutro Abs: 3.3 10*3/uL (ref 1.7–7.7)
Neutro Abs: 4.2 10*3/uL (ref 1.7–7.7)
Neutrophils Relative %: 52 %
Neutrophils Relative %: 59 %
PLATELETS: 174 10*3/uL (ref 150–400)
PLATELETS: 165 10*3/uL (ref 150–400)
RBC: 4.15 MIL/uL — AB (ref 4.22–5.81)
RBC: 3.85 MIL/uL — ABNORMAL LOW (ref 4.22–5.81)
RDW: 11.9 % (ref 11.5–15.5)
RDW: 11.8 % (ref 11.5–15.5)
WBC: 6.2 10*3/uL (ref 4.0–10.5)
WBC: 7.2 10*3/uL (ref 4.0–10.5)

## 2018-01-04 LAB — URINALYSIS, ROUTINE W REFLEX MICROSCOPIC
Bilirubin Urine: NEGATIVE
GLUCOSE, UA: NEGATIVE mg/dL
HGB URINE DIPSTICK: NEGATIVE
KETONES UR: NEGATIVE mg/dL
Leukocytes, UA: NEGATIVE
NITRITE: NEGATIVE
PH: 7 (ref 5.0–8.0)
PROTEIN: NEGATIVE mg/dL
SPECIFIC GRAVITY, URINE: 1.024 (ref 1.005–1.030)

## 2018-01-04 LAB — I-STAT CHEM 8, ED
BUN: 18 mg/dL (ref 6–20)
BUN: 16 mg/dL (ref 6–20)
CALCIUM ION: 1.09 mmol/L — AB (ref 1.15–1.40)
CALCIUM ION: 1.13 mmol/L — AB (ref 1.15–1.40)
Chloride: 103 mmol/L (ref 101–111)
Chloride: 103 mmol/L (ref 101–111)
Creatinine, Ser: 0.8 mg/dL (ref 0.61–1.24)
Creatinine, Ser: 0.8 mg/dL (ref 0.61–1.24)
Glucose, Bld: 94 mg/dL (ref 65–99)
Glucose, Bld: 90 mg/dL (ref 65–99)
HCT: 40 % (ref 39.0–52.0)
HEMATOCRIT: 35 % — AB (ref 39.0–52.0)
Hemoglobin: 13.6 g/dL (ref 13.0–17.0)
Hemoglobin: 11.9 g/dL — ABNORMAL LOW (ref 13.0–17.0)
POTASSIUM: 4.1 mmol/L (ref 3.5–5.1)
Potassium: 4.1 mmol/L (ref 3.5–5.1)
SODIUM: 139 mmol/L (ref 135–145)
SODIUM: 139 mmol/L (ref 135–145)
TCO2: 25 mmol/L (ref 22–32)
TCO2: 24 mmol/L (ref 22–32)

## 2018-01-04 LAB — RAPID URINE DRUG SCREEN, HOSP PERFORMED
Amphetamines: POSITIVE — AB
BARBITURATES: NOT DETECTED
BENZODIAZEPINES: NOT DETECTED
COCAINE: POSITIVE — AB
OPIATES: POSITIVE — AB
Tetrahydrocannabinol: NOT DETECTED

## 2018-01-04 LAB — COMPREHENSIVE METABOLIC PANEL
ALT: 34 U/L (ref 17–63)
AST: 24 U/L (ref 15–41)
Albumin: 4.1 g/dL (ref 3.5–5.0)
Alkaline Phosphatase: 62 U/L (ref 38–126)
Anion gap: 9 (ref 5–15)
BILIRUBIN TOTAL: 0.7 mg/dL (ref 0.3–1.2)
BUN: 18 mg/dL (ref 6–20)
CHLORIDE: 103 mmol/L (ref 101–111)
CO2: 24 mmol/L (ref 22–32)
CREATININE: 0.78 mg/dL (ref 0.61–1.24)
Calcium: 8.8 mg/dL — ABNORMAL LOW (ref 8.9–10.3)
Glucose, Bld: 94 mg/dL (ref 65–99)
POTASSIUM: 4 mmol/L (ref 3.5–5.1)
Sodium: 136 mmol/L (ref 135–145)
TOTAL PROTEIN: 7.3 g/dL (ref 6.5–8.1)

## 2018-01-04 LAB — I-STAT TROPONIN, ED
TROPONIN I, POC: 0 ng/mL (ref 0.00–0.08)
Troponin i, poc: 0 ng/mL (ref 0.00–0.08)

## 2018-01-04 MED ORDER — SODIUM CHLORIDE 0.9 % IV BOLUS
1000.0000 mL | Freq: Once | INTRAVENOUS | Status: AC
  Administered 2018-01-04: 1000 mL via INTRAVENOUS

## 2018-01-04 MED ORDER — HYDROXYZINE HCL 25 MG PO TABS: 25.0000 mg | ORAL_TABLET | Freq: Four times a day (QID) | ORAL | 0 refills | Status: AC | PRN
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
## 2018-01-04 NOTE — Discharge Instructions (Signed)
Follow up with [facility] and stop using any drugs
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
## 2018-01-04 NOTE — ED Provider Notes (Signed)
Patient was seen as an overdose but gave the wrong first name.  He is now checked and on the correct name so this will not be considered his visit   Bethann Berkshire, MD 01/04/18 2316

## 2018-01-04 NOTE — ED Notes (Signed)
Patient arrived at 2017 via ems for overdose. Patient was unresponsive at approximately 7pm. Patient currently alert. Patient has unable to sit still. Cooperative

## 2018-01-05 LAB — ETHANOL: Alcohol, Ethyl (B): <10 mg/dL (ref <10)

## 2018-10-07 ENCOUNTER — Emergency Department (HOSPITAL_COMMUNITY): Payer: No Typology Code available for payment source

## 2018-10-07 ENCOUNTER — Emergency Department (HOSPITAL_COMMUNITY)
Admission: EM | Admit: 2018-10-07 | Discharge: 2018-10-07 | Disposition: A | Discharge status: Home / Self Care | Payer: No Typology Code available for payment source | Attending: Emergency Medicine | Admitting: Emergency Medicine

## 2018-10-07 DIAGNOSIS — F17210 Nicotine dependence, cigarettes, uncomplicated: Secondary | ICD-10-CM | POA: Insufficient documentation

## 2018-10-07 DIAGNOSIS — S2241XA Multiple fractures of ribs, right side, initial encounter for closed fracture: Secondary | ICD-10-CM | POA: Diagnosis not present

## 2018-10-07 DIAGNOSIS — S63642A Sprain of metacarpophalangeal joint of left thumb, initial encounter: Secondary | ICD-10-CM | POA: Insufficient documentation

## 2018-10-07 DIAGNOSIS — Y939 Activity, unspecified: Secondary | ICD-10-CM | POA: Diagnosis not present

## 2018-10-07 DIAGNOSIS — Y92410 Unspecified street and highway as the place of occurrence of the external cause: Secondary | ICD-10-CM | POA: Insufficient documentation

## 2018-10-07 DIAGNOSIS — Y998 Other external cause status: Secondary | ICD-10-CM | POA: Insufficient documentation

## 2018-10-07 DIAGNOSIS — S0990XA Unspecified injury of head, initial encounter: Secondary | ICD-10-CM | POA: Diagnosis present

## 2018-10-07 MED ORDER — NAPROXEN 500 MG PO TABS: 500.0000 mg | ORAL_TABLET | Freq: Two times a day (BID) | ORAL | 0 refills | Status: AC

## 2018-10-07 MED ORDER — ACETAMINOPHEN 500 MG PO TABS
1000.0000 mg | ORAL_TABLET | Freq: Once | ORAL | Status: AC
  Administered 2018-10-07: 1000 mg via ORAL
  Filled 2018-10-07: qty 2

## 2018-10-07 MED ORDER — LIDOCAINE 5 % EX PTCH: 1.0000 | MEDICATED_PATCH | CUTANEOUS | 0 refills | Status: AC

## 2018-10-07 NOTE — Discharge Instructions (Addendum)
Your x-ray showed fractures of your ninth and 10th ribs on the right side.  Make sure that you keep your pain well controlled with naproxen and the use of topical Lidoderm patches.  You have been given a splint for support of your left thumb.  Your x-ray does not show any significant fracture.  No dislocation.  We recommend icing areas of pain and injury to limit swelling.  Follow-up with a primary care doctor to ensure that symptoms resolve.  You may return to the ED for new or concerning symptoms.
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
## 2018-10-07 NOTE — ED Notes (Signed)
Paged ortho tech 

## 2019-08-18 ENCOUNTER — Other Ambulatory Visit: Payer: Self-pay

## 2019-08-18 ENCOUNTER — Emergency Department (HOSPITAL_COMMUNITY): Payer: Self-pay

## 2019-08-18 ENCOUNTER — Emergency Department (HOSPITAL_COMMUNITY)
Admission: EM | Admit: 2019-08-18 | Discharge: 2019-08-18 | Disposition: A | Discharge status: Home / Self Care | Payer: Self-pay | Attending: Emergency Medicine | Admitting: Emergency Medicine

## 2019-08-18 ENCOUNTER — Encounter (HOSPITAL_COMMUNITY): Payer: Self-pay

## 2019-08-18 DIAGNOSIS — R059 Cough, unspecified: Secondary | ICD-10-CM

## 2019-08-18 DIAGNOSIS — F17210 Nicotine dependence, cigarettes, uncomplicated: Secondary | ICD-10-CM | POA: Insufficient documentation

## 2019-08-18 DIAGNOSIS — Z791 Long term (current) use of non-steroidal anti-inflammatories (NSAID): Secondary | ICD-10-CM | POA: Insufficient documentation

## 2019-08-18 DIAGNOSIS — R52 Pain, unspecified: Secondary | ICD-10-CM

## 2019-08-18 DIAGNOSIS — M791 Myalgia, unspecified site: Secondary | ICD-10-CM | POA: Insufficient documentation

## 2019-08-18 DIAGNOSIS — R432 Parageusia: Secondary | ICD-10-CM | POA: Insufficient documentation

## 2019-08-18 DIAGNOSIS — R05 Cough: Secondary | ICD-10-CM | POA: Insufficient documentation

## 2019-08-18 DIAGNOSIS — Z20828 Contact with and (suspected) exposure to other viral communicable diseases: Secondary | ICD-10-CM | POA: Insufficient documentation

## 2019-08-18 DIAGNOSIS — Z79899 Other long term (current) drug therapy: Secondary | ICD-10-CM | POA: Insufficient documentation

## 2019-08-18 MED ORDER — ALBUTEROL SULFATE HFA 108 (90 BASE) MCG/ACT IN AERS: 1.0000 | INHALATION_SPRAY | Freq: Four times a day (QID) | RESPIRATORY_TRACT | 0 refills | Status: AC | PRN

## 2019-08-18 MED ORDER — BENZONATATE 100 MG PO CAPS: 100.0000 mg | ORAL_CAPSULE | Freq: Three times a day (TID) | ORAL | 0 refills | Status: AC

## 2019-08-18 NOTE — ED Triage Notes (Signed)
No response when called to triage from lobby.

## 2019-08-18 NOTE — Discharge Instructions (Signed)
Test Results for COVID-19 pending  You have a test pending for COVID-19.  Results typically return within about 48 hours.  Be sure to check MyChart for updated results.  We recommend isolating yourself until results are received.  Patients who have symptoms consistent with COVID-19 should self isolated for: At least 3 days (72 hours) have passed since recovery, defined as resolution of fever without the use of fever reducing medications and improvement in respiratory symptoms (e.g., cough, shortness of breath), and At least 7 days have passed since symptoms first appeared.  If you have no symptoms, but your test returns positive, recommend isolating for at least 10 days.   Your symptoms are likely consistent with a viral illness. Viruses do not require or respond to antibiotics. Treatment is symptomatic care and it is important to note that these symptoms may last for 7-14 days.   Hand washing: Wash your hands throughout the day, but especially before and after touching the face, using the restroom, sneezing, coughing, or touching surfaces that have been coughed or sneezed upon. Hydration: Symptoms of most illnesses will be intensified and complicated by dehydration. Dehydration can also extend the duration of symptoms. Drink plenty of fluids and get plenty of rest. You should be drinking at least half a liter of water an hour to stay hydrated. Electrolyte drinks (ex. Gatorade, Powerade, Pedialyte) are also encouraged. You should be drinking enough fluids to make your urine light yellow, almost clear. If this is not the case, you are not drinking enough water. Please note that some of the treatments indicated below will not be effective if you are not adequately hydrated. Diet: Please concentrate on hydration, however, you may introduce food slowly.  Start with a clear liquid diet, progressed to a full liquid diet, and then bland solids as you are able. Pain or fever: Ibuprofen, Naproxen, or  acetaminophen (generic for Tylenol) for pain or fever.  Antiinflammatory medications: Take 600 mg of ibuprofen every 6 hours or 440 mg (over the counter dose) to 500 mg (prescription dose) of naproxen every 12 hours for the next 3 days. After this time, these medications may be used as needed for pain. Take these medications with food to avoid upset stomach. Choose only one of these medications, do not take them together. Acetaminophen (generic for Tylenol): Should you continue to have additional pain while taking the ibuprofen or naproxen, you may add in acetaminophen as needed. Your daily total maximum amount of acetaminophen from all sources should be limited to 4000mg /day for persons without liver problems, or 2000mg /day for those with liver problems. Cough: Use the benzonatate (generic for Tessalon) for cough.  Teas, warm liquids, broths, and honey can also help with cough. Albuterol: May use the albuterol as needed for instances of shortness of breath. Zyrtec or Claritin: May add these medication daily to control underlying symptoms of congestion, sneezing, and other signs of allergies.  These medications are available over-the-counter. Generics: Cetirizine (generic for Zyrtec) and loratadine (generic for Claritin). Fluticasone: Use fluticasone (generic for Flonase), as directed, for nasal and sinus congestion.  This medication is available over-the-counter. Congestion: Plain guaifenesin (generic for plain Mucinex) may help relieve congestion. Saline sinus rinses and saline nasal sprays may also help relieve congestion. If you do not have high blood pressure, heart problems, or an allergy to such medications, you may also try phenylephrine or Sudafed. Sore throat: Warm liquids or Chloraseptic spray may help soothe a sore throat. Gargle twice a day with a salt water  solution made from a half teaspoon of salt in a cup of warm water.  Follow up: Follow up with a primary care provider within the next two  weeks should symptoms fail to resolve. Return: Return to the ED for significantly worsening symptoms, shortness of breath, persistent vomiting, large amounts of blood in stool, or any other major concerns.  For prescription assistance, may try using prescription discount sites or apps, such as goodrx.com

## 2019-08-18 NOTE — ED Triage Notes (Signed)
Pt c/o generalized body aches. Pt also adds that he has been coughing and has fatigue. NAD.

## 2019-08-18 NOTE — ED Provider Notes (Signed)
[facility]-EMERGENCY DEPT Provider Note   CSN: [id number] Arrival date & time: 08/18/19  1809     History Chief Complaint  Patient presents with  . Generalized Body Aches    Anthony [facility] is a 28 y.o. male.  HPI      Anthony [facility] is a 28 y.o. male, with a history of drug abuse and tobacco abuse, presenting to the ED with cough beginning yesterday.  Accompanied by body aches, fatigue, occasional shortness of breath, and loss of taste.  Denies fever/chills, chest pain, abdominal pain, N/V/D, back pain, current shortness of breath   History reviewed. No pertinent past medical history.  Patient Active Problem List   Diagnosis Date Noted  . Cellulitis and abscess 01/01/2016  . History of drug abuse (HCC) 01/01/2016    Past Surgical History:  Procedure Laterality Date  . APPENDECTOMY    . tubes in ears         No family history on file.  Social History   Tobacco Use  . Smoking status: Current Every Day Smoker    Packs/day: 1.00    Types: Cigarettes  . Smokeless tobacco: Never Used  Substance Use Topics  . Alcohol use: Yes    Comment: occasionally  . Drug use: Yes    Types: Marijuana    Comment: "from time to time"    Home Medications Prior to Admission medications   Medication Sig Start Date End Date Taking? Authorizing Provider  albuterol (VENTOLIN HFA) 108 (90 Base) MCG/ACT inhaler Inhale 1-2 puffs into the lungs every 6 (six) hours as needed for wheezing or shortness of breath. 08/18/19   Notnamed Croucher C, PA-C  benzonatate (TESSALON) 100 MG capsule Take 1 capsule (100 mg total) by mouth every 8 (eight) hours. 08/18/19   Jenisse Vullo C, PA-C  diphenhydrAMINE-zinc acetate (BENADRYL) cream Apply 1 application topically 3 (three) times daily as needed for itching.    [provider]  hydrOXYzine (ATARAX/VISTARIL) 25 MG tablet Take 1 tablet (25 mg total) by mouth every 6 (six) hours as needed for anxiety or nausea. 01/04/18    Bethann Berkshire, MD  ketorolac (TORADOL) 10 MG tablet Take 1 tablet (10 mg total) by mouth every 6 (six) hours as needed. 01/03/16   Jerald Kief, MD  lidocaine (LIDODERM) 5 % Place 1 patch onto the skin daily. Remove & Discard patch within 12 hours or as directed by MD 10/07/18   Antony Madura, PA-C  naproxen (NAPROSYN) 500 MG tablet Take 1 tablet (500 mg total) by mouth 2 (two) times daily. 10/07/18   Antony Madura, PA-C  sulfamethoxazole-trimethoprim (BACTRIM DS,SEPTRA DS) 800-160 MG tablet Take 1 tablet by mouth 2 (two) times daily. 01/03/16   Jerald Kief, MD    Allergies    Doxycycline and Vicodin [hydrocodone-acetaminophen]  Review of Systems   Review of Systems  Constitutional: Negative for chills and fever.  HENT:       Loss of taste  Respiratory: Positive for cough and shortness of breath (none currently).   Cardiovascular: Negative for chest pain and leg swelling.  Gastrointestinal: Negative for abdominal pain, diarrhea, nausea and vomiting.  Musculoskeletal: Positive for myalgias.  Neurological: Negative for syncope.  All other systems reviewed and are negative.   Physical Exam Updated Vital Signs BP (!) 142/81   Pulse (!) 104   Temp 97.8 F (36.6 C) (Oral)   Resp 16   Wt 77.6 kg   SpO2 98%   BMI 23.19 kg/m  Physical Exam Vitals and nursing note reviewed.  Constitutional:      General: He is not in acute distress.    Appearance: He is well-developed. He is not diaphoretic.  HENT:     Head: Normocephalic and atraumatic.     Mouth/Throat:     Mouth: Mucous membranes are moist.     Pharynx: Oropharynx is clear.  Eyes:     Conjunctiva/sclera: Conjunctivae normal.  Cardiovascular:     Rate and Rhythm: Normal rate and regular rhythm.     Pulses: Normal pulses.          Radial pulses are 2+ on the right side and 2+ on the left side.     Heart sounds: Normal heart sounds.     Comments: Tactile temperature in the extremities appropriate and equal  bilaterally. Pulmonary:     Effort: Pulmonary effort is normal. No respiratory distress.     Breath sounds: Normal breath sounds.  Abdominal:     Palpations: Abdomen is soft.     Tenderness: There is no abdominal tenderness. There is no guarding.  Musculoskeletal:     Cervical back: Neck supple.     Right lower leg: No edema.     Left lower leg: No edema.  Lymphadenopathy:     Cervical: No cervical adenopathy.  Skin:    General: Skin is warm and dry.  Neurological:     Mental Status: He is alert.  Psychiatric:        Mood and Affect: Mood and affect normal.        Speech: Speech normal.        Behavior: Behavior normal.     ED Results / Procedures / Treatments   Labs (all labs ordered are listed, but only abnormal results are displayed) Labs Reviewed  NOVEL CORONAVIRUS, NAA (HOSP ORDER, SEND-OUT TO REF LAB; TAT 18-24 HRS)    EKG None  Radiology DG Chest 2 View  Result Date: 08/18/2019 CLINICAL DATA:  Cough, COVID?  Additional history provided: Fatigue. EXAM: CHEST - 2 VIEW COMPARISON:  Radiographs of the chest and right ribs 10/07/2018 FINDINGS: Heart size within normal limits. There is no airspace consolidation within the lungs. No evidence of pleural effusion or pneumothorax. No acute bony abnormality. IMPRESSION: No evidence of acute cardiopulmonary abnormality. Electronically Signed   By: Jackey LogeKyle  Golden DO   On: 08/18/2019 18:57    Procedures Procedures (including critical care time)  Medications Ordered in ED Medications - No data to display  ED Course  I have reviewed the triage vital signs and the nursing notes.  Pertinent labs & imaging results that were available during my care of the patient were reviewed by me and considered in my medical decision making (see chart for details).    MDM Rules/Calculators/A&P                      Patient presents with cough, body aches, loss of taste. Patient is nontoxic appearing, afebrile, not tachycardic on my exam,  not tachypneic, not hypotensive, excellent SPO2 on room air, and is in no apparent distress.  Covid test pending at time of discharge. The patient was given instructions for home care as well as return precautions. Patient voices understanding of these instructions, accepts the plan, and is comfortable with discharge.   Anthony Sanchez was evaluated in Emergency Department on 08/18/2019 for the symptoms described in the history of present illness. He was evaluated in the context of the global  COVID-19 pandemic, which necessitated consideration that the patient might be at risk for infection with the SARS-CoV-2 virus that causes COVID-19. Institutional protocols and algorithms that pertain to the evaluation of patients at risk for COVID-19 are in a state of rapid change based on information released by regulatory bodies including the CDC and federal and state organizations. These policies and algorithms were followed during the patient's care in the ED. Final Clinical Impression(s) / ED Diagnoses Final diagnoses:  Cough  Body aches  Loss of taste    Rx / DC Orders ED Discharge Orders         Ordered    benzonatate (TESSALON) 100 MG capsule  Every 8 hours     08/18/19 2007    albuterol (VENTOLIN HFA) 108 (90 Base) MCG/ACT inhaler  Every 6 hours PRN     08/18/19 2007           Layla Maw 08/18/19 2045    Lacretia Leigh, MD 08/19/19 1126

## 2019-08-20 LAB — NOVEL CORONAVIRUS, NAA (HOSP ORDER, SEND-OUT TO REF LAB; TAT 18-24 HRS): SARS-CoV-2, NAA: NOT DETECTED

## 2020-05-18 IMAGING — DX DG RIBS W/ CHEST 3+V*R*
4 series · 4 of 4 positions shown · non-contrast
Comparison: 01/05/2019

CLINICAL DATA: Posttraumatic right rib pain

EXAM:
RIGHT RIBS AND CHEST - 3+ VIEW

[chest pa]
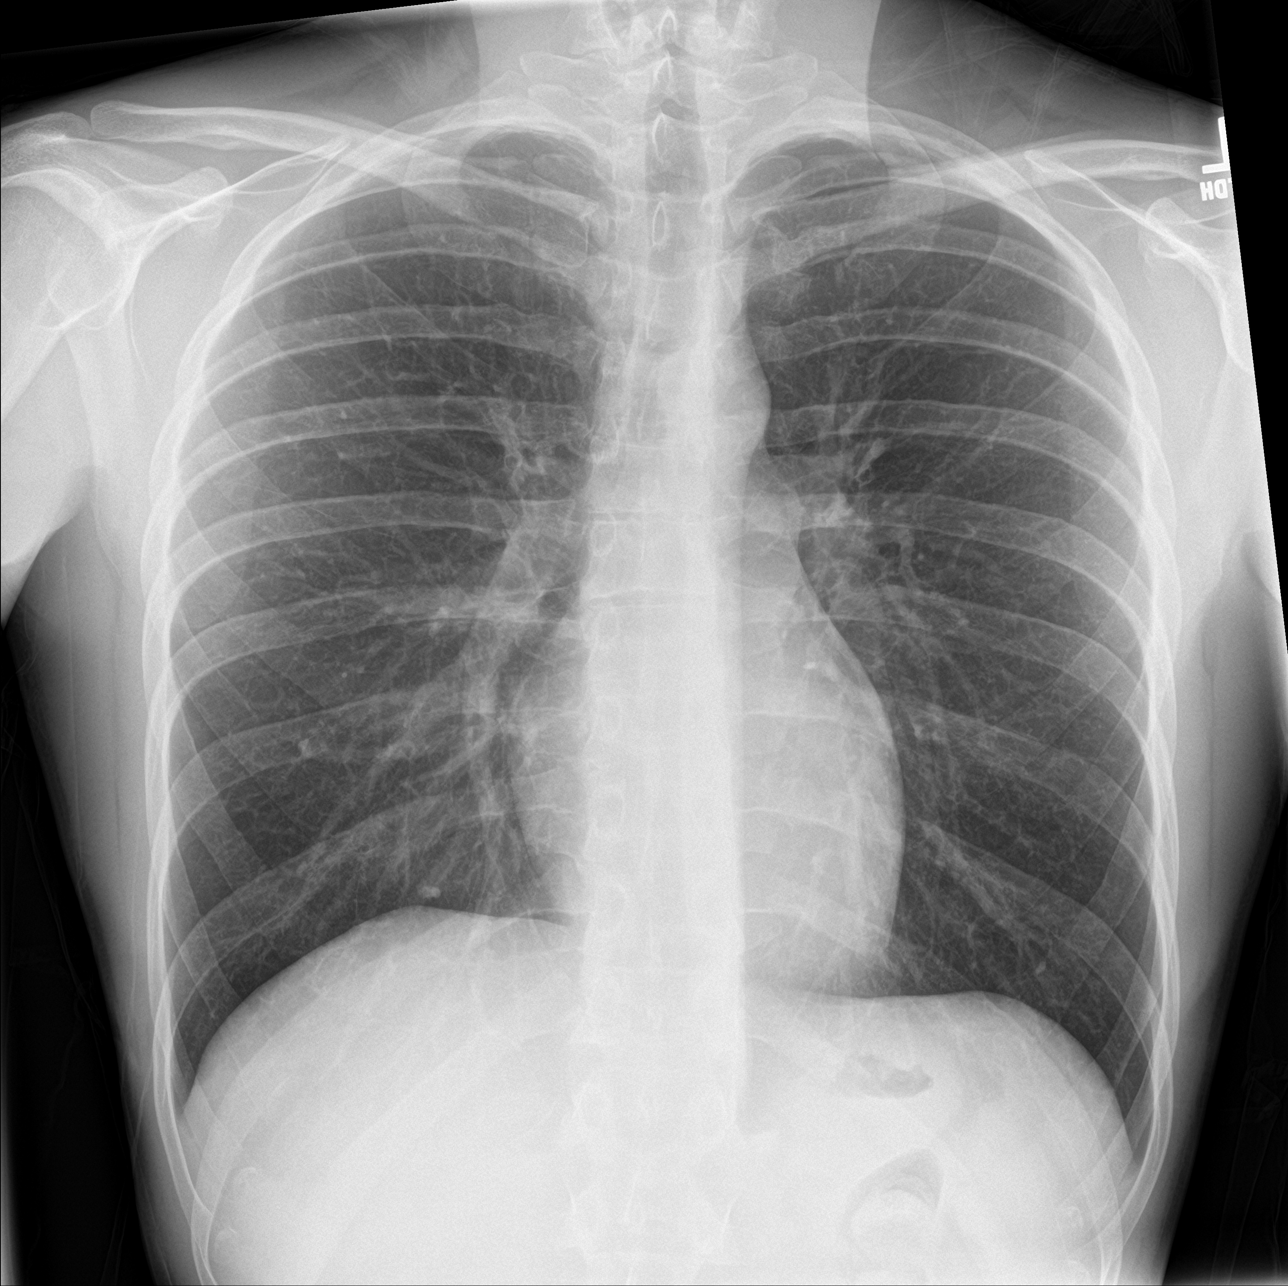

[rib pa (1 of 2)]
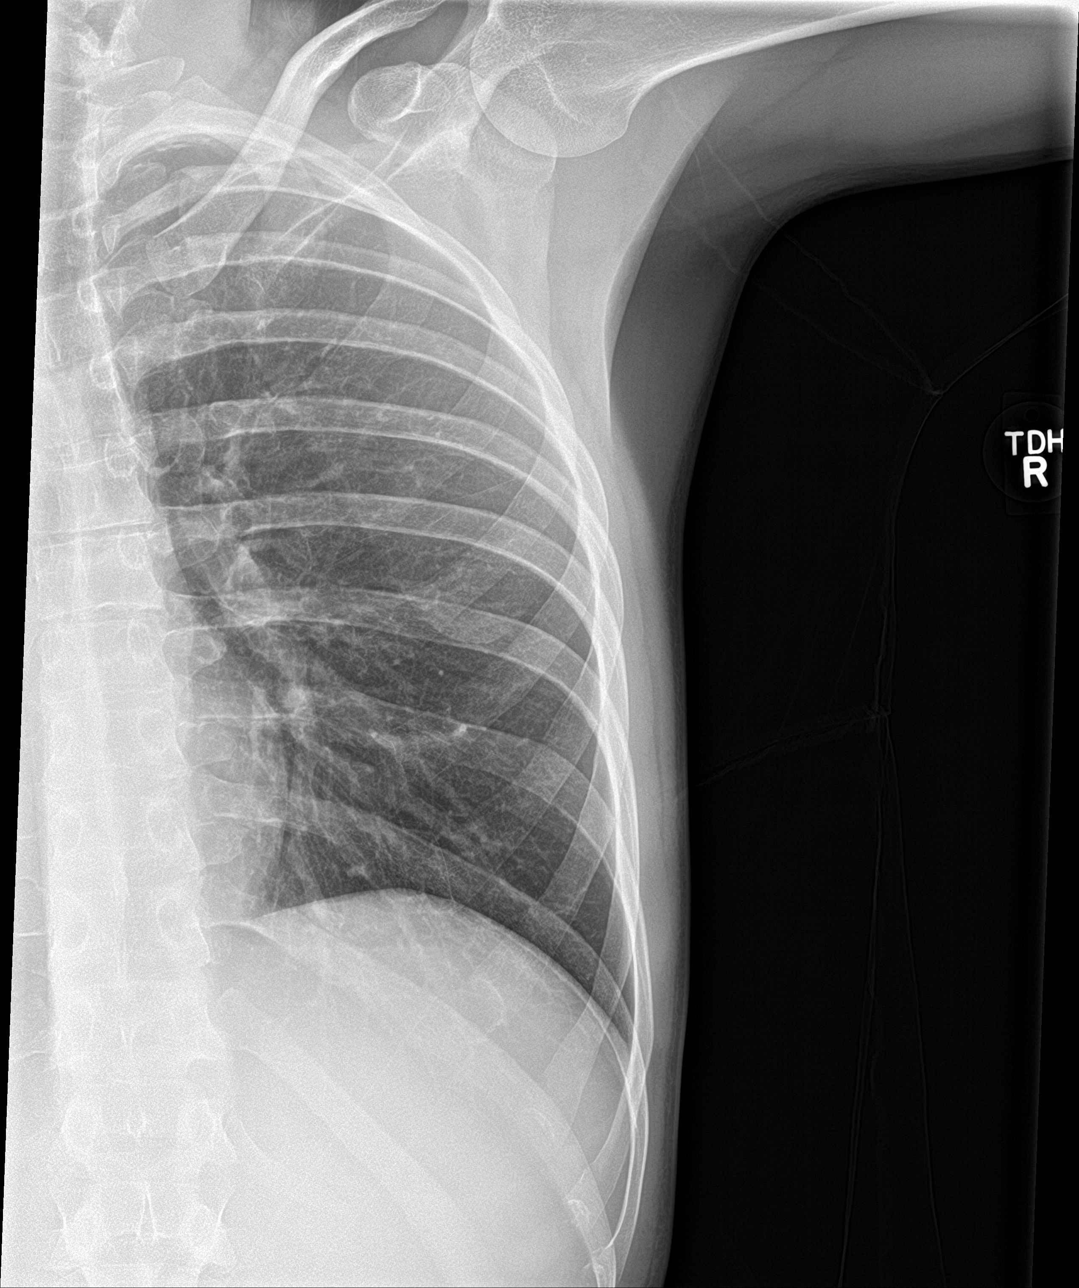

[rib pa obl]
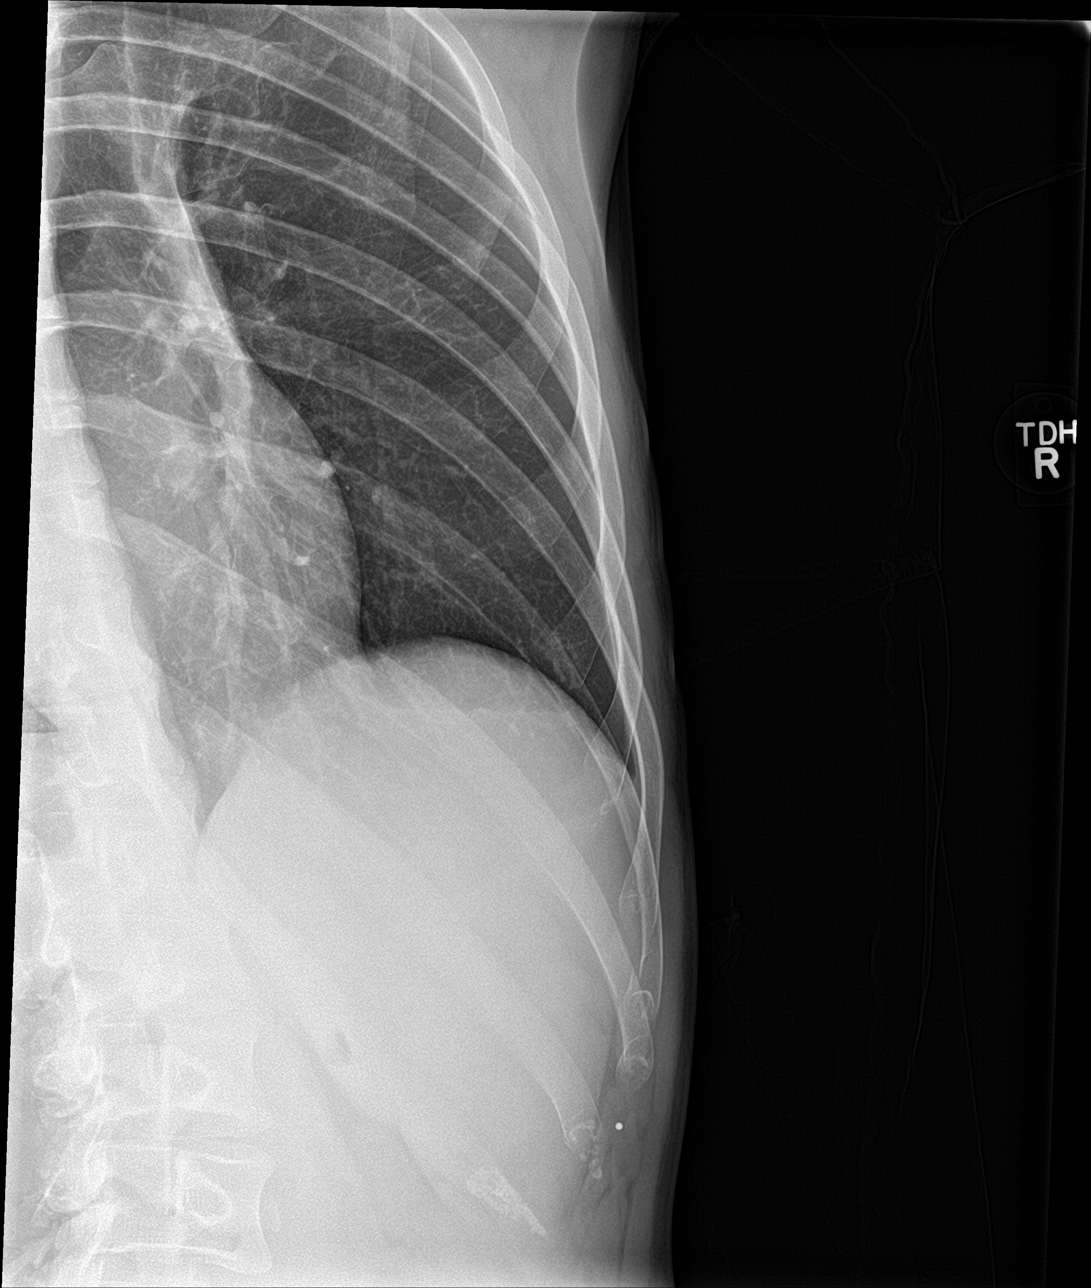

[rib pa (2 of 2)]
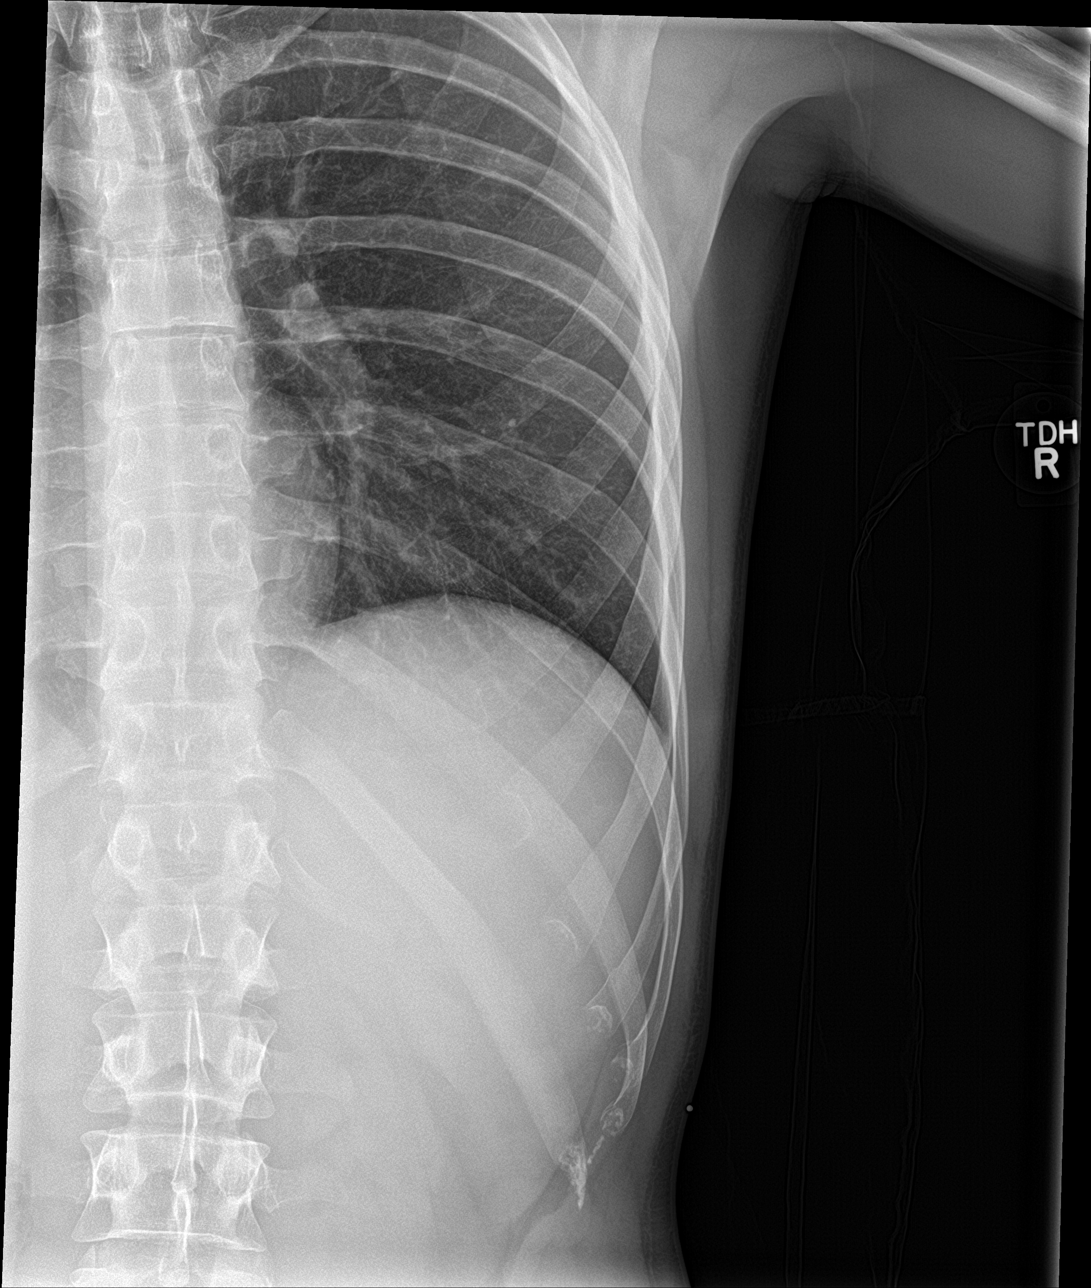

[4 of 4 positions shown; findings below may reference images not displayed]

FINDINGS: Nondisplaced anterior right ninth and tenth rib fractures as marked.
On a few images there is suspicion of trace right apical
pneumothorax, but this is attributed to apical scarring based on
contemporaneous cervical spine CT which also covers this area. No
pleural fluid or lung contusion. Normal heart size.
IMPRESSION: 1. Nondisplaced right anterior ninth and tenth rib fractures.
2. No evident intrathoracic injury.
